# Patient Record
Sex: Female | Born: 1935 | Race: White | Hispanic: No | Smoking: Former smoker
Health system: Southern US, Community
[De-identification: ages and names within clinical notes are randomized; demographics above are authoritative.]

## PROBLEM LIST (undated history)

## (undated) DIAGNOSIS — J449 Chronic obstructive pulmonary disease, unspecified: Secondary | ICD-10-CM

## (undated) DIAGNOSIS — N3281 Overactive bladder: Secondary | ICD-10-CM

## (undated) DIAGNOSIS — E079 Disorder of thyroid, unspecified: Secondary | ICD-10-CM

## (undated) DIAGNOSIS — M1990 Unspecified osteoarthritis, unspecified site: Secondary | ICD-10-CM

## (undated) DIAGNOSIS — I10 Essential (primary) hypertension: Secondary | ICD-10-CM

## (undated) DIAGNOSIS — I671 Cerebral aneurysm, nonruptured: Secondary | ICD-10-CM

## (undated) DIAGNOSIS — E7800 Pure hypercholesterolemia, unspecified: Secondary | ICD-10-CM

## (undated) HISTORY — PX: APPENDECTOMY: SHX54

## (undated) HISTORY — PX: TUBAL LIGATION: SHX77

## (undated) HISTORY — PX: TONSILLECTOMY: SUR1361

---

## 1997-07-29 DIAGNOSIS — I671 Cerebral aneurysm, nonruptured: Secondary | ICD-10-CM

## 1997-07-29 HISTORY — DX: Cerebral aneurysm, nonruptured: I67.1

## 1997-12-30 ENCOUNTER — Emergency Department (HOSPITAL_COMMUNITY): Admission: EM | Admit: 1997-12-30 | Discharge: 1997-12-30 | Payer: Self-pay | Admitting: Emergency Medicine

## 1999-08-30 ENCOUNTER — Ambulatory Visit (HOSPITAL_COMMUNITY): Admission: RE | Admit: 1999-08-30 | Discharge: 1999-08-30 | Payer: Self-pay | Admitting: *Deleted

## 2000-02-15 ENCOUNTER — Ambulatory Visit (HOSPITAL_COMMUNITY): Admission: RE | Admit: 2000-02-15 | Discharge: 2000-02-15 | Payer: Self-pay | Admitting: *Deleted

## 2001-09-11 ENCOUNTER — Other Ambulatory Visit: Admission: RE | Admit: 2001-09-11 | Discharge: 2001-09-11 | Payer: Self-pay | Admitting: *Deleted

## 2010-08-19 ENCOUNTER — Encounter: Payer: Self-pay | Admitting: Internal Medicine

## 2012-09-11 ENCOUNTER — Emergency Department (HOSPITAL_COMMUNITY): Payer: Medicare Other

## 2012-09-11 ENCOUNTER — Inpatient Hospital Stay (HOSPITAL_COMMUNITY)
Admission: EM | Admit: 2012-09-11 | Discharge: 2012-09-16 | DRG: 689 | Disposition: A | Discharge status: Skilled Nursing Facility | Payer: Medicare Other | Attending: Internal Medicine | Admitting: Internal Medicine

## 2012-09-11 ENCOUNTER — Encounter (HOSPITAL_COMMUNITY): Payer: Self-pay | Admitting: *Deleted

## 2012-09-11 DIAGNOSIS — Z79899 Other long term (current) drug therapy: Secondary | ICD-10-CM

## 2012-09-11 DIAGNOSIS — J189 Pneumonia, unspecified organism: Secondary | ICD-10-CM

## 2012-09-11 DIAGNOSIS — E871 Hypo-osmolality and hyponatremia: Secondary | ICD-10-CM

## 2012-09-11 DIAGNOSIS — E039 Hypothyroidism, unspecified: Secondary | ICD-10-CM

## 2012-09-11 DIAGNOSIS — G92 Toxic encephalopathy: Secondary | ICD-10-CM | POA: Diagnosis present

## 2012-09-11 DIAGNOSIS — R4182 Altered mental status, unspecified: Secondary | ICD-10-CM

## 2012-09-11 DIAGNOSIS — E7800 Pure hypercholesterolemia, unspecified: Secondary | ICD-10-CM | POA: Diagnosis present

## 2012-09-11 DIAGNOSIS — G929 Unspecified toxic encephalopathy: Secondary | ICD-10-CM

## 2012-09-11 DIAGNOSIS — J4489 Other specified chronic obstructive pulmonary disease: Secondary | ICD-10-CM | POA: Diagnosis present

## 2012-09-11 DIAGNOSIS — R630 Anorexia: Secondary | ICD-10-CM

## 2012-09-11 DIAGNOSIS — E875 Hyperkalemia: Secondary | ICD-10-CM

## 2012-09-11 DIAGNOSIS — K5900 Constipation, unspecified: Secondary | ICD-10-CM

## 2012-09-11 DIAGNOSIS — I10 Essential (primary) hypertension: Secondary | ICD-10-CM

## 2012-09-11 DIAGNOSIS — W19XXXA Unspecified fall, initial encounter: Secondary | ICD-10-CM

## 2012-09-11 DIAGNOSIS — H3530 Unspecified macular degeneration: Secondary | ICD-10-CM | POA: Diagnosis present

## 2012-09-11 DIAGNOSIS — J449 Chronic obstructive pulmonary disease, unspecified: Secondary | ICD-10-CM | POA: Diagnosis present

## 2012-09-11 DIAGNOSIS — H409 Unspecified glaucoma: Secondary | ICD-10-CM | POA: Diagnosis present

## 2012-09-11 DIAGNOSIS — Z6826 Body mass index (BMI) 26.0-26.9, adult: Secondary | ICD-10-CM

## 2012-09-11 DIAGNOSIS — N390 Urinary tract infection, site not specified: Principal | ICD-10-CM

## 2012-09-11 HISTORY — DX: Disorder of thyroid, unspecified: E07.9

## 2012-09-11 HISTORY — DX: Overactive bladder: N32.81

## 2012-09-11 HISTORY — DX: Cerebral aneurysm, nonruptured: I67.1

## 2012-09-11 HISTORY — DX: Pure hypercholesterolemia, unspecified: E78.00

## 2012-09-11 HISTORY — DX: Unspecified osteoarthritis, unspecified site: M19.90

## 2012-09-11 HISTORY — DX: Chronic obstructive pulmonary disease, unspecified: J44.9

## 2012-09-11 HISTORY — DX: Essential (primary) hypertension: I10

## 2012-09-11 LAB — CBC WITH DIFFERENTIAL/PLATELET
Basophils Absolute: 0 10*3/uL (ref 0.0–0.1)
Lymphocytes Relative: 14 % (ref 12–46)
Lymphs Abs: 1.7 10*3/uL (ref 0.7–4.0)
MCV: 91.5 fL (ref 78.0–100.0)
Monocytes Relative: 9 % (ref 3–12)
Neutrophils Relative %: 76 % (ref 43–77)
Platelets: 279 10*3/uL (ref 150–400)
RDW: 12.9 % (ref 11.5–15.5)
WBC: 12 10*3/uL — ABNORMAL HIGH (ref 4.0–10.5)

## 2012-09-11 LAB — URINALYSIS, ROUTINE W REFLEX MICROSCOPIC
Nitrite: NEGATIVE
Protein, ur: NEGATIVE mg/dL
Specific Gravity, Urine: 1.021 (ref 1.005–1.030)
Urobilinogen, UA: 0.2 mg/dL (ref 0.0–1.0)

## 2012-09-11 LAB — COMPREHENSIVE METABOLIC PANEL
ALT: 11 U/L (ref 0–35)
Albumin: 3.3 g/dL — ABNORMAL LOW (ref 3.5–5.2)
Alkaline Phosphatase: 59 U/L (ref 39–117)
Potassium: 3.9 mEq/L (ref 3.5–5.1)
Sodium: 133 mEq/L — ABNORMAL LOW (ref 135–145)
Total Protein: 7 g/dL (ref 6.0–8.3)

## 2012-09-11 LAB — URINE MICROSCOPIC-ADD ON

## 2012-09-11 LAB — BASIC METABOLIC PANEL
Chloride: 96 mEq/L (ref 96–112)
Creatinine, Ser: 0.73 mg/dL (ref 0.50–1.10)
GFR calc Af Amer: 87 mL/min — ABNORMAL LOW (ref >90)
Sodium: 128 mEq/L — ABNORMAL LOW (ref 135–145)

## 2012-09-11 MED ORDER — HYDRALAZINE HCL 20 MG/ML IJ SOLN
5.0000 mg | Freq: Four times a day (QID) | INTRAMUSCULAR | Status: DC | PRN
  Administered 2012-09-13: 5 mg via INTRAVENOUS
  Filled 2012-09-11: qty 1

## 2012-09-11 MED ORDER — ACETAMINOPHEN 325 MG PO TABS: 650.0000 mg | ORAL_TABLET | Freq: Four times a day (QID) | ORAL | Status: DC | PRN

## 2012-09-11 MED ORDER — DEXTROSE 5 % IV SOLN
1.0000 g | Freq: Once | INTRAVENOUS | Status: AC
  Administered 2012-09-11: 1 g via INTRAVENOUS
  Filled 2012-09-11: qty 10

## 2012-09-11 MED ORDER — ONDANSETRON HCL 4 MG/2ML IJ SOLN: 4.0000 mg | Freq: Four times a day (QID) | INTRAMUSCULAR | Status: DC | PRN

## 2012-09-11 MED ORDER — ONDANSETRON HCL 4 MG PO TABS: 4.0000 mg | ORAL_TABLET | Freq: Four times a day (QID) | ORAL | Status: DC | PRN

## 2012-09-11 MED ORDER — ENOXAPARIN SODIUM 40 MG/0.4ML ~~LOC~~ SOLN
40.0000 mg | SUBCUTANEOUS | Status: DC
  Administered 2012-09-11 – 2012-09-15 (×4): 40 mg via SUBCUTANEOUS
  Filled 2012-09-11 (×6): qty 0.4

## 2012-09-11 MED ORDER — LATANOPROST 0.005 % OP SOLN
1.0000 [drp] | Freq: Every day | OPHTHALMIC | Status: DC
  Administered 2012-09-11 – 2012-09-15 (×4): 1 [drp] via OPHTHALMIC
  Filled 2012-09-11: qty 2.5

## 2012-09-11 MED ORDER — RAMIPRIL 5 MG PO CAPS
5.0000 mg | ORAL_CAPSULE | Freq: Two times a day (BID) | ORAL | Status: DC
  Administered 2012-09-11 – 2012-09-13 (×4): 5 mg via ORAL
  Filled 2012-09-11 (×7): qty 1

## 2012-09-11 MED ORDER — SODIUM CHLORIDE 0.9 % IV BOLUS (SEPSIS)
500.0000 mL | Freq: Once | INTRAVENOUS | Status: AC
  Administered 2012-09-11: 500 mL via INTRAVENOUS

## 2012-09-11 MED ORDER — SODIUM CHLORIDE 0.9 % IV SOLN: INTRAVENOUS | Status: AC

## 2012-09-11 MED ORDER — DEXTROSE-NACL 5-0.45 % IV SOLN
INTRAVENOUS | Status: DC
  Administered 2012-09-11 – 2012-09-13 (×3): via INTRAVENOUS

## 2012-09-11 MED ORDER — ALBUTEROL SULFATE (5 MG/ML) 0.5% IN NEBU
2.5000 mg | INHALATION_SOLUTION | RESPIRATORY_TRACT | Status: DC | PRN
  Administered 2012-09-15 (×2): 2.5 mg via RESPIRATORY_TRACT
  Filled 2012-09-11 (×3): qty 0.5

## 2012-09-11 MED ORDER — ACETAMINOPHEN 650 MG RE SUPP: 650.0000 mg | Freq: Four times a day (QID) | RECTAL | Status: DC | PRN

## 2012-09-11 MED ORDER — DEXTROSE 5 % IV SOLN
1.0000 g | INTRAVENOUS | Status: DC
  Administered 2012-09-12: 1 g via INTRAVENOUS
  Filled 2012-09-11: qty 10

## 2012-09-11 MED ORDER — HYDROCODONE-ACETAMINOPHEN 5-325 MG PO TABS: 1.0000 | ORAL_TABLET | ORAL | Status: DC | PRN

## 2012-09-11 MED ORDER — DORZOLAMIDE HCL-TIMOLOL MAL 2-0.5 % OP SOLN
1.0000 [drp] | Freq: Two times a day (BID) | OPHTHALMIC | Status: DC
  Administered 2012-09-11 – 2012-09-16 (×9): 1 [drp] via OPHTHALMIC
  Filled 2012-09-11: qty 10

## 2012-09-11 MED ORDER — ZOLPIDEM TARTRATE 5 MG PO TABS
5.0000 mg | ORAL_TABLET | Freq: Every evening | ORAL | Status: DC | PRN
  Filled 2012-09-11: qty 1

## 2012-09-11 MED ORDER — LEVOTHYROXINE SODIUM 50 MCG PO TABS
50.0000 ug | ORAL_TABLET | Freq: Every day | ORAL | Status: DC
  Administered 2012-09-12 – 2012-09-16 (×5): 50 ug via ORAL
  Filled 2012-09-11 (×6): qty 1

## 2012-09-11 NOTE — ED Notes (Signed)
QIO:NG29<BM> Expected date:<BR> Expected time:<BR> Means of arrival:<BR> Comments:<BR> **Rm 7**Elderly, one episode confusion, A&amp;O per ems

## 2012-09-11 NOTE — ED Provider Notes (Signed)
Medical screening examination/treatment/procedure(s) were performed by non-physician practitioner and as supervising physician I was immediately available for consultation/collaboration.  Ethelda Chick, MD 09/11/12 [phone]

## 2012-09-11 NOTE — H&P (Addendum)
[facility]                                                                                    Patient Demographics  Julie Francis, is a 79 y.o. female  CSN: [id number]  MRN: [id number]  DOB - Oct 02, 1922  Admit Date - 09/11/2012  Outpatient Primary MD for the patient is Florentina Jenny, MD   With History of -  Past Medical History  Diagnosis Date  . Hypertension   . High cholesterol   . Arthritis   . COPD (chronic obstructive pulmonary disease)   . Thyroid disease   . Overactive bladder   . Brain aneurysm 1999    no medical follow up needed      Past Surgical History  Procedure Laterality Date  . Appendectomy    . Tubal ligation    . Tonsillectomy      in for   Chief Complaint  Patient presents with  . Altered Mental Status     HPI  Julie Francis  is a 79 y.o. female, with past medical history significant for hypothyroidism and hypertension presenting with 2 days history of worsening mentation. Patient denies any chest pains, shortness of breath. She has a headache and mild dizziness. Family was worried and they took her under their care however the mental status started getting worse. There is a history of fall 3 days ago but no head trauma, she just slid from the chair and hit her right hip. The patient usually lives alone and takes care of herself.    Review of Systems   Denies chest pains or shortness of breath, denies abdominal pain diarrhea nausea or vomiting.  get any more . No fever or chills, no history of seizures CVA or TIAs.   Social History History  Substance Use Topics  . Smoking status: Former Smoker    Quit date: 09/11/1994  . Smokeless tobacco: Never Used  . Alcohol Use: No     Family History Her father died of liver cirrhosis, her mother died of uterine cancer   Prior to Admission medications   Medication Sig Start Date End Date Taking? Authorizing Provider  beta carotene w/minerals (OCUVITE) tablet Take 1 tablet  by mouth daily.   Yes Historical Provider, MD  Cranberry 450 MG CAPS Take 1 capsule by mouth daily.   Yes Historical Provider, MD  dorzolamide-timolol (COSOPT) 22.3-6.8 MG/ML ophthalmic solution Place 1 drop into both eyes 2 (two) times daily.   Yes Historical Provider, MD  latanoprost (XALATAN) 0.005 % ophthalmic solution Place 1 drop into both eyes at bedtime.   Yes Historical Provider, MD  levothyroxine (SYNTHROID, LEVOTHROID) 50 MCG tablet Take 50 mcg by mouth daily before breakfast.   Yes Historical Provider, MD  ramipril (ALTACE) 5 MG capsule Take 5 mg by mouth 2 (two) times daily.   Yes Historical Provider, MD    No Known Allergies  Physical Exam  Vitals  Blood pressure 173/91, pulse 89, temperature 98.6 F (37 C), temperature source Oral, resp. rate 16, SpO2 95.00%.   1. General white American female lying in bed in no acute distress.  2. Normal affect and insight, Not  Suicidal or Homicidal, confused  3. No F.N deficits, ALL C.Nerves Intact, Strength 5/5 all 4 extremities, Sensation intact all 4 extremities, Plantars down going.  4. Ears and Eyes appear Normal, Conjunctivae clear, PERRLA. Moist Oral Mucosa.  5. Supple Neck, No JVD, No cervical lymphadenopathy appriciated, No Carotid Bruits.  6. Symmetrical Chest wall movement, Good air movement bilaterally, CTAB.  7. RRR, No Gallops, Rubs or Murmurs, No Parasternal Heave.  8. Positive Bowel Sounds, Abdomen Soft, Non tender, No organomegaly appriciated,No rebound -guarding or rigidity.  9.  No Cyanosis, Normal Skin Turgor, No Skin Rash or Bruise.  10. Good muscle tone,  joints appear normal , no effusions, Normal ROM.  11. No Palpable Lymph Nodes in Neck or Axillae    Data Review  CBC  Recent Labs Lab 09/11/12 1632  WBC 12.0*  HGB 15.1*  HCT 43.0  PLT 279  MCV 91.5  MCH 32.1  MCHC 35.1  RDW 12.9  LYMPHSABS 1.7  MONOABS 1.1*  EOSABS 0.1  BASOSABS 0.0    ------------------------------------------------------------------------------------------------------------------  Chemistries   Recent Labs Lab 09/11/12 1632 09/11/12 1808  NA 128* 133*  K 6.3* 3.9  CL 96 100  CO2 23 23  GLUCOSE 106* 98  BUN 24* 23  CREATININE 0.73 0.75  CALCIUM 10.4 10.4  AST  --  14  ALT  --  11  ALKPHOS  --  59  BILITOT  --  0.7   ------------------------------------------------------------------------------------------------------------------   ---------------------------------------------------------------------------------------------------------------  Urinalysis    Component Value Date/Time   COLORURINE YELLOW 09/11/2012 1722   APPEARANCEUR CLOUDY* 09/11/2012 1722   LABSPEC 1.021 09/11/2012 1722   PHURINE 5.0 09/11/2012 1722   GLUCOSEU NEGATIVE 09/11/2012 1722   HGBUR NEGATIVE 09/11/2012 1722   BILIRUBINUR NEGATIVE 09/11/2012 1722   KETONESUR 15* 09/11/2012 1722   PROTEINUR NEGATIVE 09/11/2012 1722   UROBILINOGEN 0.2 09/11/2012 1722   NITRITE NEGATIVE 09/11/2012 1722   LEUKOCYTESUR MODERATE* 09/11/2012 1722    ----------------------------------------------------------------------------------------------------------------    Imaging results:   Ct Head Wo Contrast  09/11/2012  *RADIOLOGY REPORT*  Clinical Data: Altered mental status.  Recent fall.  CT HEAD WITHOUT CONTRAST  Technique:  Contiguous axial images were obtained from the base of the skull through the vertex without contrast.  Comparison: None.  Findings: There is atrophy and chronic small vessel disease changes. No acute intracranial abnormality.  Specifically, no hemorrhage, hydrocephalus, mass lesion, acute infarction, or significant intracranial injury.  No acute calvarial abnormality.  Soft tissue swelling noted over the high posterior calvarium.  IMPRESSION: No acute intracranial abnormality.  Atrophy, chronic microvascular disease.   Original Report Authenticated By: Charlett Nose,  M.D.    Dg Chest Portable 1 View  09/11/2012  *RADIOLOGY REPORT*  Clinical Data: History of altered mental status.  History of hypertension and COPD.  PORTABLE CHEST - 1 VIEW  Comparison: None.  Findings: Cardiac silhouette is upper range of normal in size. Ectasia of thoracic aorta is seen.  The patient is mildly rotated toward the right.  There is overall slight hyperinflation configuration.  No pulmonary edema, pneumonia, or pleural effusion is seen.  Minimal degenerative spondylosis is present.  IMPRESSION: Overall slight hyperinflation configuration.  No pulmonary edema, pneumonia, or pleural effusion.   Original Report Authenticated By: Onalee Hua Call      Assessment & Plan  1. urinary tract infection; patient was started on IV Rocephin, urine culture pending 2. Altered mental status, probably due to to UTI,Check vitamin B12 and TSH levels 3. Glaucoma and macular degeneration,  continue eye drops 4. Hypothyroidism, continue Synthroid  Plan As mentioned above, the patient will be started on IV antibiotics and IV fluids. Check vitamin B12 and TSH levels. If no improvement in the morning, and MRI should be ordered.    DVT Prophylaxis  Lovenox  AM Labs Ordered, also please review Full Orders  Family Communication: Admission, patients condition and plan of care including tests being ordered have been discussed with the patient and daughters  who indicate understanding and agree with the plan and Code Status.  Code Status full  Disposition Plan: Admit to MedSurg then probably discharge home with home health or nursing facility  Time spent in minutes : 35 minutes  Condition GUARDED*

## 2012-09-11 NOTE — ED Provider Notes (Signed)
History     CSN: [id number]  Arrival date & time 09/11/12  1520   First MD Initiated Contact with Patient 09/11/12 1538      No chief complaint on file.   (Consider location/radiation/quality/duration/timing/severity/associated sxs/prior treatment) Patient is a 79 y.o. female presenting with altered mental status. The history is provided by the patient and a relative.  Altered Mental Status This is a new problem. The current episode started yesterday. Associated symptoms include headaches. Pertinent negatives include no abdominal pain, chest pain, coughing, fever, nausea, neck pain or vomiting. Associated symptoms comments: Per patient and family, she fell 3 days ago. She slid from a sitting position on her bed onto the floor landing on her right hip. She does not remember the fall and does not remember whether or not she hit her head, or whether there was any LOC. She was able to call 9-1-1 and EMS evaluated patient in the home but did not feel transport was necessary. She continued to ambulate with her walker after the fall, however, the family reports that she does not use the right leg as well - not lifting it off the floor when walking, not able to lift the leg onto the bed when lying down. Yesterday, the patient became confused and disoriented and complains of a constant headache. No N, V. She has had a decreased appetite. No cough or fever. .    Past Medical History  Diagnosis Date  . Hypertension   . High cholesterol   . Arthritis   . COPD (chronic obstructive pulmonary disease)   . Thyroid disease   . Overactive bladder   . Brain aneurysm 1999    no medical follow up needed    Past Surgical History  Procedure Laterality Date  . Appendectomy    . Tubal ligation    . Tonsillectomy      History reviewed. No pertinent family history.  History  Substance Use Topics  . Smoking status: Not on file  . Smokeless tobacco: Not on file  . Alcohol Use: Not on file    OB  History   Grav Para Term Preterm Abortions TAB SAB Ect Mult Living                  Review of Systems  Constitutional: Negative for fever.  HENT: Negative for neck pain.   Eyes: Negative for visual disturbance.       H/O macular degeneration with poor eyesight usually.  Respiratory: Negative for cough and shortness of breath.   Cardiovascular: Negative for chest pain.  Gastrointestinal: Negative for nausea, vomiting and abdominal pain.  Genitourinary: Negative for dysuria and pelvic pain.  Neurological: Positive for headaches.  Hematological: Does not bruise/bleed easily.  Psychiatric/Behavioral: Positive for confusion and altered mental status.    Allergies  Review of patient's allergies indicates no known allergies.  Home Medications   Current Outpatient Rx  Name  Route  Sig  Dispense  Refill  . beta carotene w/minerals (OCUVITE) tablet   Oral   Take 1 tablet by mouth daily.         . Cranberry 450 MG CAPS   Oral   Take 1 capsule by mouth daily.         . dorzolamide-timolol (COSOPT) 22.3-6.8 MG/ML ophthalmic solution   Both Eyes   Place 1 drop into both eyes 2 (two) times daily.         Marland Kitchen latanoprost (XALATAN) 0.005 % ophthalmic solution   Both Eyes  Place 1 drop into both eyes at bedtime.         Marland Kitchen levothyroxine (SYNTHROID, LEVOTHROID) 50 MCG tablet   Oral   Take 50 mcg by mouth daily before breakfast.         . ramipril (ALTACE) 5 MG capsule   Oral   Take 5 mg by mouth 2 (two) times daily.           BP 173/91  Pulse 89  Temp(Src) 98.6 F (37 C) (Oral)  Resp 16  SpO2 95%  Physical Exam  Nursing note and vitals reviewed. Constitutional: She appears well-developed and well-nourished.  HENT:  Head: Normocephalic and atraumatic.  Neck: Normal range of motion. Neck supple.  Cardiovascular: Normal rate and regular rhythm.   Pulmonary/Chest: Effort normal. She has no wheezes. She has rales. She exhibits no tenderness.  Abdominal: Soft.  Bowel sounds are normal. There is no tenderness. There is no rebound and no guarding.  Musculoskeletal: Normal range of motion.  Neurological: She is alert.  She is oriented to person.  Skin: Skin is warm and dry. No rash noted.  Psychiatric: She has a normal mood and affect.    ED Course  Procedures (including critical care time)  Labs Reviewed  URINALYSIS, ROUTINE W REFLEX MICROSCOPIC  CBC WITH DIFFERENTIAL  BASIC METABOLIC PANEL   No results found.   No diagnosis found. 1. Altered mental status 2. uti    MDM  Per family, patient is confused "drastically" above baseline mental status. She has lost her appetite. UTI on lab studies - negative CT head. Discussed admission with family and patient - all questions answered.  [facility] consulted and accepts patient for admission.        Arnoldo Hooker, PA-C 09/11/12 1938

## 2012-09-11 NOTE — ED Notes (Signed)
Per EMS pt from home and family has reported pt has AMS. EMS originally called for Stroke but no neuro deficits noted by them.Pt hypertensive 180/88. HR-72 and irregular. CBG 98.

## 2012-09-12 ENCOUNTER — Inpatient Hospital Stay (HOSPITAL_COMMUNITY): Payer: Medicare Other

## 2012-09-12 DIAGNOSIS — E875 Hyperkalemia: Secondary | ICD-10-CM | POA: Diagnosis present

## 2012-09-12 DIAGNOSIS — W19XXXA Unspecified fall, initial encounter: Secondary | ICD-10-CM

## 2012-09-12 DIAGNOSIS — R4182 Altered mental status, unspecified: Secondary | ICD-10-CM

## 2012-09-12 DIAGNOSIS — I10 Essential (primary) hypertension: Secondary | ICD-10-CM | POA: Diagnosis present

## 2012-09-12 DIAGNOSIS — K5900 Constipation, unspecified: Secondary | ICD-10-CM | POA: Diagnosis present

## 2012-09-12 DIAGNOSIS — E871 Hypo-osmolality and hyponatremia: Secondary | ICD-10-CM | POA: Diagnosis present

## 2012-09-12 LAB — VITAMIN B12: Vitamin B-12: 861 pg/mL (ref 211–911)

## 2012-09-12 LAB — TSH: TSH: 1.819 u[IU]/mL (ref 0.350–4.500)

## 2012-09-12 LAB — URINE CULTURE: Colony Count: 45000

## 2012-09-12 MED ORDER — DIPHENHYDRAMINE HCL 25 MG PO CAPS
25.0000 mg | ORAL_CAPSULE | Freq: Once | ORAL | Status: AC
  Administered 2012-09-12: 25 mg via ORAL
  Filled 2012-09-12: qty 1

## 2012-09-12 MED ORDER — PHENAZOPYRIDINE HCL 100 MG PO TABS
100.0000 mg | ORAL_TABLET | Freq: Three times a day (TID) | ORAL | Status: AC
  Administered 2012-09-12 – 2012-09-14 (×8): 100 mg via ORAL
  Filled 2012-09-12 (×9): qty 1

## 2012-09-12 MED ORDER — PHENAZOPYRIDINE HCL 100 MG PO TABS: 100.0000 mg | ORAL_TABLET | Freq: Three times a day (TID) | ORAL | Status: DC

## 2012-09-12 MED ORDER — DOCUSATE SODIUM 100 MG PO CAPS
100.0000 mg | ORAL_CAPSULE | Freq: Two times a day (BID) | ORAL | Status: DC
  Administered 2012-09-12 – 2012-09-13 (×3): 100 mg via ORAL
  Filled 2012-09-12 (×6): qty 1

## 2012-09-12 NOTE — Progress Notes (Addendum)
[facility] PROGRESS NOTE  Julie Francis ZOX:[id number] DOB: [birth year] DOA: 09/11/2012 PCP: Florentina Jenny, MD  Brief narrative: Julie Francis is an 79 y.o. female with a past medical history of hypothyroidism, hypertension, and COPD who presented to the hospital on 09/11/2012 with a 2 day history of altered mental status. She also had a fall 3 days prior to presentation. Upon initial evaluation emergency department, patient had an abnormal urinalysis and her presenting symptoms were attributed to urinary tract infection, for which she was referred to the hospitalist service for further inpatient treatment.  Assessment/Plan: Principal Problem:   UTI (lower urinary tract infection) -Admitted and placed on empiric Rocephin. Followup urine cultures. Active Problems:   Constipation -Colace BID ordered.   Encephalopathy, toxic -Thought to be secondary to urinary tract infection. Treat and monitor for recovery of mental status.   Hypothyroidism -TSH within normal limits. Continue current dose of Synthroid.   Glaucoma / Macular degeneration -Continue Cosopt and Xalatan.   Fall -Left hip films done, no fracture noted. -Physical therapy evaluation when able to participate.   Unspecified essential hypertension -Continue Altace.   Hyponatremia -Likely from dehydration. Improving with IV fluids.   Hyperkalemia -Appears to have been spurious. Potassium was within normal limits upon recheck.   Code Status: Full Family Communication: Daughter at bedside. Disposition Plan: Home versus SNF depending on PT evaluation (Daughter thinks it is unsafe for her to return home to independent living).   Medical Consultants:  None.  Other Consultants:  Physical therapy.  Anti-infectives:  Rocephin 09/11/2012--->  HPI/Subjective: Julie Francis is complaining of some neck pain this morning. She also complains of constipation. Appetite fair. She tells me she has lost some weight. She has  had some dysuria. Had episodes of urinary incontinence through the night.  Objective: Filed Vitals:   09/11/12 1534 09/11/12 2046 09/11/12 2123 09/12/12 0540  BP: 173/91 168/66 157/75 161/74  Pulse: 89  74 71  Temp: 98.6 F (37 C) 98.5 F (36.9 C) 97.9 F (36.6 C) 97.9 F (36.6 C)  TempSrc: Oral Oral Oral Oral  Resp: 16 20 20 24   Height:   5\' 3"  (1.6 m)   Weight:   68.2 kg (150 lb 5.7 oz)   SpO2: 95% 95% 95% 95%    Intake/Output Summary (Last 24 hours) at 09/12/12 0820 Last data filed at 09/12/12 0657  Gross per 24 hour  Intake      0 ml  Output    725 ml  Net   -725 ml    Exam: Gen:  NAD Cardiovascular:  RRR, No M/R/G Respiratory:  Lungs CTAB Gastrointestinal:  Abdomen soft, NT/ND, + BS Extremities:  No C/E/C  Data Reviewed: Basic Metabolic Panel:  Recent Labs Lab 09/11/12 1632 09/11/12 1808  NA 128* 133*  K 6.3* 3.9  CL 96 100  CO2 23 23  GLUCOSE 106* 98  BUN 24* 23  CREATININE 0.73 0.75  CALCIUM 10.4 10.4   GFR Estimated Creatinine Clearance: 45.9 ml/min (by C-G formula based on Cr of 0.75). Liver Function Tests:  Recent Labs Lab 09/11/12 1808  AST 14  ALT 11  ALKPHOS 59  BILITOT 0.7  PROT 7.0  ALBUMIN 3.3*   CBC:  Recent Labs Lab 09/11/12 1632  WBC 12.0*  NEUTROABS 9.1*  HGB 15.1*  HCT 43.0  MCV 91.5  PLT 279   Thyroid function studies  Recent Labs  09/11/12 1815  TSH 1.819   Anemia work up  Recent Labs  09/11/12 1815  VITAMINB12  861   Microbiology No results found for this or any previous visit (from the past 240 hour(s)).   Procedures and Diagnostic Studies: Dg Hip Complete Left  09/12/2012  *RADIOLOGY REPORT*  Clinical Data: Fall  LEFT HIP - COMPLETE 2+ VIEW  Comparison: None.  Findings: No acute fracture and no dislocation.  Osteopenia. Degenerative changes in the lumbar spine are prominent.  Moderate degenerative changes in the hip joints.  IMPRESSION: No acute bony pathology.   Original Report Authenticated By:  Jolaine Click, M.D.    Ct Head Wo Contrast  09/11/2012  *RADIOLOGY REPORT*  Clinical Data: Altered mental status.  Recent fall.  CT HEAD WITHOUT CONTRAST  Technique:  Contiguous axial images were obtained from the base of the skull through the vertex without contrast.  Comparison: None.  Findings: There is atrophy and chronic small vessel disease changes. No acute intracranial abnormality.  Specifically, no hemorrhage, hydrocephalus, mass lesion, acute infarction, or significant intracranial injury.  No acute calvarial abnormality.  Soft tissue swelling noted over the high posterior calvarium.  IMPRESSION: No acute intracranial abnormality.  Atrophy, chronic microvascular disease.   Original Report Authenticated By: Charlett Nose, M.D.    Dg Chest Portable 1 View  09/11/2012  *RADIOLOGY REPORT*  Clinical Data: History of altered mental status.  History of hypertension and COPD.  PORTABLE CHEST - 1 VIEW  Comparison: None.  Findings: Cardiac silhouette is upper range of normal in size. Ectasia of thoracic aorta is seen.  The patient is mildly rotated toward the right.  There is overall slight hyperinflation configuration.  No pulmonary edema, pneumonia, or pleural effusion is seen.  Minimal degenerative spondylosis is present.  IMPRESSION: Overall slight hyperinflation configuration.  No pulmonary edema, pneumonia, or pleural effusion.   Original Report Authenticated By: Onalee Hua Call     Scheduled Meds: . sodium chloride   Intravenous STAT  . cefTRIAXone (ROCEPHIN)  IV  1 g Intravenous Q24H  . dorzolamide-timolol  1 drop Both Eyes BID  . enoxaparin (LOVENOX) injection  40 mg Subcutaneous Q24H  . latanoprost  1 drop Both Eyes QHS  . levothyroxine  50 mcg Oral QAC breakfast  . phenazopyridine  100 mg Oral TID WC  . ramipril  5 mg Oral BID   Continuous Infusions: . dextrose 5 % and 0.45% NaCl 50 mL/hr at 09/11/12 2231    Time spent: 35 minutes.   LOS: 1 day   Julie Francis  [facility] Pager [phone].  If 8PM-8AM, please contact night-coverage at [email or web address], password [facility] 09/12/2012, 8:20 AM

## 2012-09-12 NOTE — Progress Notes (Signed)
UR completed 

## 2012-09-13 DIAGNOSIS — R630 Anorexia: Secondary | ICD-10-CM | POA: Diagnosis present

## 2012-09-13 LAB — BASIC METABOLIC PANEL
Calcium: 10 mg/dL (ref 8.4–10.5)
Creatinine, Ser: 0.73 mg/dL (ref 0.50–1.10)
GFR calc Af Amer: 87 mL/min — ABNORMAL LOW (ref >90)
GFR calc non Af Amer: 75 mL/min — ABNORMAL LOW (ref >90)

## 2012-09-13 LAB — CBC
MCHC: 33.8 g/dL (ref 30.0–36.0)
Platelets: 318 10*3/uL (ref 150–400)
RDW: 13.2 % (ref 11.5–15.5)
WBC: 7.4 10*3/uL (ref 4.0–10.5)

## 2012-09-13 MED ORDER — ENSURE COMPLETE PO LIQD
237.0000 mL | Freq: Two times a day (BID) | ORAL | Status: DC
  Administered 2012-09-13 – 2012-09-16 (×4): 237 mL via ORAL

## 2012-09-13 NOTE — Evaluation (Signed)
Physical Therapy Evaluation Patient Details Name: Julie Francis MRN: [id number] DOB: 1923-06-24 Today's Date: 09/13/2012 Time: 1478-2956 PT Time Calculation (min): 27 min  PT Assessment / Plan / Recommendation Clinical Impression  Pt presents with toxic encephalopathy in the presence of UTI with history of hypothyroidism, HTN and COPD.  Note that she also had a fall at home PTA.  Tolerated OOB and ambulation in hallway at min/mod assist in initially but improved to min assist with continued ambulation.  Pt will benefit from skilled PT in acute venue to address deficits.  PT recommends SNF for follow up at D/C to maximize pts safety and function.     PT Assessment  Patient needs continued PT services    Follow Up Recommendations  SNF;Supervision/Assistance - 24 hour    Does the patient have the potential to tolerate intense rehabilitation      Barriers to Discharge Decreased caregiver support      Equipment Recommendations  Rolling walker with 5" wheels    Recommendations for Other Services OT consult   Frequency Min 3X/week    Precautions / Restrictions Precautions Precautions: Fall Restrictions Weight Bearing Restrictions: No   Pertinent Vitals/Pain No pain      Mobility  Bed Mobility Bed Mobility: Supine to Sit;Sitting - Scoot to Edge of Bed Supine to Sit: 4: Min guard;HOB elevated;With rails Sitting - Scoot to Edge of Bed: 5: Supervision Details for Bed Mobility Assistance: Min/guard to ensure safety of trunk with cues for hand placement on bed to self assist.  Transfers Transfers: Sit to Stand;Stand to Sit Sit to Stand: 4: Min assist;From elevated surface;With upper extremity assist;From bed Stand to Sit: 4: Min guard;With upper extremity assist;With armrests;To chair/3-in-1 Details for Transfer Assistance: Assist to rise and steady with slight posterior lean noted with cues for hand placement and safety.  Ambulation/Gait Ambulation/Gait Assistance: 4: Min  assist;3: Mod assist Ambulation Distance (Feet): 115 Feet Assistive device: Rolling walker Ambulation/Gait Assistance Details: Pt initially with very slow gait pattern and difficulty initating LE advancement, however as she continued gait improved somewhat but still with unsteadiness.  Provided cues for sequencing/technique with RW and max cues to maintain upright posture.  Gait Pattern: Step-to pattern;Step-through pattern;Decreased stride length;Narrow base of support;Trunk flexed Gait velocity: decreased Stairs: No Wheelchair Mobility Wheelchair Mobility: No    Exercises     PT Diagnosis: Difficulty walking;Generalized weakness  PT Problem List: Decreased strength;Decreased activity tolerance;Decreased balance;Decreased mobility;Decreased coordination;Decreased knowledge of use of DME;Decreased safety awareness;Decreased knowledge of precautions PT Treatment Interventions: DME instruction;Gait training;Functional mobility training;Therapeutic activities;Therapeutic exercise;Balance training;Patient/family education   PT Goals Acute Rehab PT Goals PT Goal Formulation: With patient/family Time For Goal Achievement: 09/27/12 Potential to Achieve Goals: Good Pt will go Supine/Side to Sit: with supervision;with HOB 0 degrees PT Goal: Supine/Side to Sit - Progress: Goal set today Pt will go Sit to Supine/Side: with supervision PT Goal: Sit to Supine/Side - Progress: Goal set today Pt will go Sit to Stand: with supervision PT Goal: Sit to Stand - Progress: Goal set today Pt will Ambulate: 51 - 150 feet;with supervision;with least restrictive assistive device PT Goal: Ambulate - Progress: Goal set today Pt will Perform Home Exercise Program: with supervision, verbal cues required/provided PT Goal: Perform Home Exercise Program - Progress: Goal set today  Visit Information  Last PT Received On: 09/13/12 Assistance Needed: +1    Subjective Data  Subjective: Look, I'm walking Patient  Stated Goal: to get stronger.    Prior Functioning  Home Living Lives  With: Alone Available Help at Discharge: Family;Available PRN/intermittently Type of Home: Apartment Home Access: Level entry Home Layout: One level Home Adaptive Equipment: Walker - four wheeled Prior Function Level of Independence: Needs assistance Comments: Daughter states that pt does not bath often and family was checking in with her to make sure she was eating.  Communication Communication: Surveyor, mining Overall Cognitive Status: Appears within functional limits for tasks assessed/performed Arousal/Alertness: Awake/alert Orientation Level: Appears intact for tasks assessed Behavior During Session: [facility] for tasks performed    Extremity/Trunk Assessment Right Lower Extremity Assessment RLE ROM/Strength/Tone: Deficits RLE ROM/Strength/Tone Deficits: Pt with generalized weakness, grossly 3+/5 per functional assessment.  RLE Sensation: WFL - Light Touch Left Lower Extremity Assessment LLE ROM/Strength/Tone: Deficits LLE ROM/Strength/Tone Deficits: Generalized weakness, grosly 3+/5 per functional assessment.  LLE Sensation: WFL - Light Touch Trunk Assessment Trunk Assessment: Kyphotic   Balance    End of Session PT - End of Session Equipment Utilized During Treatment: Gait belt Activity Tolerance: Patient tolerated treatment well Patient left: in chair;with call bell/phone within reach;with family/visitor present Nurse Communication: Mobility status  GP     Vista Deck 09/13/2012, 2:51 PM

## 2012-09-13 NOTE — Progress Notes (Signed)
[facility] PROGRESS NOTE  Julie Francis WJX:[id number] DOB: 1923/05/05 DOA: 09/11/2012 PCP: Florentina Jenny, MD  Brief narrative: Julie Francis is an 80 y.o. female with a past medical history of hypothyroidism, hypertension, and COPD who presented to the hospital on 09/11/2012 with a 2 day history of altered mental status. She also had a fall 3 days prior to presentation. Upon initial evaluation emergency department, patient had an abnormal urinalysis and her presenting symptoms were attributed to urinary tract infection, for which she was referred to the hospitalist service for further inpatient treatment.  Assessment/Plan: Principal Problem:  Encephalopathy -Admitted and placed on empiric Rocephin for possible urinary tract infection-induced toxic encephalopathy. Followup urine cultures only grew 45,000 colonies of multiple bacterial morphotypes. We'll discontinue antibiotics for now. -Etiology unclear. CT scan of the head negative for acute changes. TSH within normal limits. Maybe from a viral syndrome. -Check B12 level. Active Problems:   Anorexia -We'll start Ensure supplements.   Constipation -Continue Colace BID.   Hypothyroidism -TSH within normal limits. Continue current dose of Synthroid.   Glaucoma / Macular degeneration -Continue Cosopt and Xalatan.   Fall -Left hip films done, no fracture noted. -Physical therapy evaluation requested.   Unspecified essential hypertension -Continue Altace.   Hyponatremia -Likely from dehydration. Resolved with IV fluids.   Hyperkalemia -Appears to have been spurious. Potassium was within normal limits upon recheck.   Code Status: Full Family Communication: Daughter at bedside. Disposition Plan: Probable SNF for rehabilitation depending on PT evaluation (Daughter thinks it is unsafe for her to return home to independent living).   Medical Consultants:  None.  Other Consultants:  Physical  therapy.  Anti-infectives:  Rocephin 09/11/2012---> 09/13/2012  HPI/Subjective: Julie Francis continues to feel poorly but has no specific localizing symptoms to speak of. She has some chronic underlying shortness of breath related to COPD for which he has refused inhalers in the past. She has a chronic cough but no change in sputum production. No nausea or vomiting. Appetite poor.  Objective: Filed Vitals:   09/12/12 0540 09/12/12 1338 09/12/12 2220 09/13/12 0638  BP: 161/74 125/51 176/79 172/78  Pulse: 71 81 79 77  Temp: 97.9 F (36.6 C) 98 F (36.7 C) 98.6 F (37 C) 97.7 F (36.5 C)  TempSrc: Oral Oral Oral Oral  Resp: 24 20 20 20   Height:      Weight:      SpO2: 95% 97% 91% 93%    Intake/Output Summary (Last 24 hours) at 09/13/12 0855 Last data filed at 09/13/12 2130  Gross per 24 hour  Intake    600 ml  Output   1350 ml  Net   -750 ml    Exam: Gen:  NAD Cardiovascular:  RRR, No M/R/G Respiratory:  Lungs CTAB Gastrointestinal:  Abdomen soft, NT/ND, + BS Extremities:  No C/E/C  Data Reviewed: Basic Metabolic Panel:  Recent Labs Lab 09/11/12 1632 09/11/12 1808 09/13/12 0359  NA 128* 133* 137  K 6.3* 3.9 3.5  CL 96 100 106  CO2 23 23 26   GLUCOSE 106* 98 105*  BUN 24* 23 12  CREATININE 0.73 0.75 0.73  CALCIUM 10.4 10.4 10.0   GFR Estimated Creatinine Clearance: 45.9 ml/min (by C-G formula based on Cr of 0.73). Liver Function Tests:  Recent Labs Lab 09/11/12 1808  AST 14  ALT 11  ALKPHOS 59  BILITOT 0.7  PROT 7.0  ALBUMIN 3.3*   CBC:  Recent Labs Lab 09/11/12 1632 09/13/12 0359  WBC 12.0* 7.4  NEUTROABS  9.1*  --   HGB 15.1* 13.5  HCT 43.0 39.9  MCV 91.5 93.4  PLT 279 318   Thyroid function studies  Recent Labs  09/11/12 1815  TSH 1.819   Anemia work up  Recent Labs  09/11/12 1815  VITAMINB12 861   Microbiology Recent Results (from the past 240 hour(s))  URINE CULTURE     Status: None   Collection Time    09/11/12   5:22 PM      Result Value Range Status   Specimen Description URINE, CLEAN CATCH   Final   Special Requests NONE   Final   Culture  Setup Time 09/12/2012 01:25   Final   Colony Count 45,000 COLONIES/ML   Final   Culture     Final   Value: Multiple bacterial morphotypes present, none predominant. Suggest appropriate recollection if clinically indicated.   Report Status 09/12/2012 FINAL   Final     Procedures and Diagnostic Studies: Dg Hip Complete Left  09/12/2012  *RADIOLOGY REPORT*  Clinical Data: Fall  LEFT HIP - COMPLETE 2+ VIEW  Comparison: None.  Findings: No acute fracture and no dislocation.  Osteopenia. Degenerative changes in the lumbar spine are prominent.  Moderate degenerative changes in the hip joints.  IMPRESSION: No acute bony pathology.   Original Report Authenticated By: Jolaine Click, M.D.    Ct Head Wo Contrast  09/11/2012  *RADIOLOGY REPORT*  Clinical Data: Altered mental status.  Recent fall.  CT HEAD WITHOUT CONTRAST  Technique:  Contiguous axial images were obtained from the base of the skull through the vertex without contrast.  Comparison: None.  Findings: There is atrophy and chronic small vessel disease changes. No acute intracranial abnormality.  Specifically, no hemorrhage, hydrocephalus, mass lesion, acute infarction, or significant intracranial injury.  No acute calvarial abnormality.  Soft tissue swelling noted over the high posterior calvarium.  IMPRESSION: No acute intracranial abnormality.  Atrophy, chronic microvascular disease.   Original Report Authenticated By: Charlett Nose, M.D.    Dg Chest Portable 1 View  09/11/2012  *RADIOLOGY REPORT*  Clinical Data: History of altered mental status.  History of hypertension and COPD.  PORTABLE CHEST - 1 VIEW  Comparison: None.  Findings: Cardiac silhouette is upper range of normal in size. Ectasia of thoracic aorta is seen.  The patient is mildly rotated toward the right.  There is overall slight hyperinflation  configuration.  No pulmonary edema, pneumonia, or pleural effusion is seen.  Minimal degenerative spondylosis is present.  IMPRESSION: Overall slight hyperinflation configuration.  No pulmonary edema, pneumonia, or pleural effusion.   Original Report Authenticated By: Onalee Hua Call     Scheduled Meds: . cefTRIAXone (ROCEPHIN)  IV  1 g Intravenous Q24H  . docusate sodium  100 mg Oral BID  . dorzolamide-timolol  1 drop Both Eyes BID  . enoxaparin (LOVENOX) injection  40 mg Subcutaneous Q24H  . latanoprost  1 drop Both Eyes QHS  . levothyroxine  50 mcg Oral QAC breakfast  . phenazopyridine  100 mg Oral TID WC  . ramipril  5 mg Oral BID   Continuous Infusions: . dextrose 5 % and 0.45% NaCl 50 mL/hr at 09/12/12 2020    Time spent: 25 minutes.   LOS: 2 days   Zackariah Vanderpol  [facility] Pager [phone].  If 8PM-8AM, please contact night-coverage at [email or web address], password [facility] 09/13/2012, 8:55 AM

## 2012-09-14 MED ORDER — DOCUSATE SODIUM 100 MG PO CAPS
100.0000 mg | ORAL_CAPSULE | Freq: Two times a day (BID) | ORAL | Status: DC | PRN
  Administered 2012-09-16: 100 mg via ORAL
  Filled 2012-09-14 (×2): qty 1

## 2012-09-14 MED ORDER — RAMIPRIL 10 MG PO CAPS
10.0000 mg | ORAL_CAPSULE | Freq: Two times a day (BID) | ORAL | Status: DC
  Administered 2012-09-14 – 2012-09-16 (×4): 10 mg via ORAL
  Filled 2012-09-14 (×6): qty 1

## 2012-09-14 NOTE — Progress Notes (Signed)
Clinical Social Work Department BRIEF PSYCHOSOCIAL ASSESSMENT 09/14/2012  Patient:  [facility]     Account Number:  [id number]     Admit date:  09/11/2012  Clinical Social Worker:  Jacelyn Grip  Date/Time:  09/14/2012 11:20 AM  Referred by:  Physician  Date Referred:  09/14/2012 Referred for  SNF Placement   Other Referral:   Interview type:  Patient Other interview type:   and patient daughter at bedside    PSYCHOSOCIAL DATA Living Status:  ALONE Admitted from facility:   Level of care:   Primary support name:  Cardell Peach Lochner/[phone] Primary support relationship to patient:  CHILD, ADULT Degree of support available:   strong    CURRENT CONCERNS Current Concerns  Post-Acute Placement   Other Concerns:    SOCIAL WORK ASSESSMENT / PLAN CSW received referral for new snf placement.    CSW met with pt and pt daughter, Cardell Peach at bedside re: discharge planning. CSW discussed recommendation for New SNF and explained process. Pt reports that she lives alone in an retirement apartment and is agreeable to short term rehab.    CSW completed FL2 and initiated SNF search to [facility].    CSW to follow up with pt and pt daughter in regard to bed offers.    Per pt and pt daughter, anticipate pt will be medically stable for discharge tomorrow.    CSW to facilitate pt discharge needs when pt medically stable for discharge.   Assessment/plan status:  Psychosocial Support/Ongoing Assessment of Needs Other assessment/ plan:   discharge planning.   Information/referral to community resources:   [facility] list    PATIENT'S/FAMILY'S RESPONSE TO PLAN OF CARE: Pt alert and oriented x 4. Pt daughter at bedside and supportive and actively involved in pt care. Pt is agreeable to rehab at SNF.     Jacklynn Lewis, MSW, LCSWA  Clinical Social Work [phone]

## 2012-09-14 NOTE — Progress Notes (Signed)
CSW provided pt and pt daughter bed offers.   Pt and pt daughter choose bed at [facility].  CSW spoke to facility who confirmed bed availability for tomorrow Tuesday, 2/18.   CSW to facilitate pt discharge needs to [facility] on Tuesday 2/18.   Jacklynn Lewis, MSW, LCSWA  Clinical Social Work [phone]

## 2012-09-14 NOTE — Progress Notes (Signed)
Physical Therapy Treatment Patient Details Name: Julie Francis MRN: [id number] DOB: 1922-09-27 Today's Date: 09/14/2012 Time: 1015-1027 PT Time Calculation (min): 12 min  PT Assessment / Plan / Recommendation Comments on Treatment Session  pt progressing; planning and will will benefit from SNF    Follow Up Recommendations  SNF;Supervision/Assistance - 24 hour     Does the patient have the potential to tolerate intense rehabilitation     Barriers to Discharge        Equipment Recommendations  None recommended by PT    Recommendations for Other Services    Frequency Min 3X/week   Plan Discharge plan remains appropriate;Frequency remains appropriate    Precautions / Restrictions Precautions Precautions: Fall   Pertinent Vitals/Pain     Mobility  Bed Mobility Bed Mobility: Supine to Sit;Sitting - Scoot to Edge of Bed Supine to Sit: 5: Supervision Sitting - Scoot to Edge of Bed: 5: Supervision Details for Bed Mobility Assistance: increased tiem and cues for hand placement/use of UEs to scoot Transfers Transfers: Sit to Stand;Stand to Sit;Stand Pivot Transfers Sit to Stand: 4: Min assist;From elevated surface;With upper extremity assist;From bed Stand to Sit: 4: Min guard;With upper extremity assist;With armrests;To chair/3-in-1 Stand Pivot Transfers: 4: Min guard Details for Transfer Assistance: Assist to rise and steady with slight posterior lean noted with cues for hand placement and safety.  Ambulation/Gait Ambulation/Gait Assistance: 4: Min assist;4: Min Government social research officer (Feet): 120 Feet Assistive device: Rolling walker Ambulation/Gait Assistance Details: cues for upward gaze, posture, Rw distance from self Gait Pattern: Step-to pattern;Step-through pattern;Decreased stride length;Narrow base of support;Trunk flexed Gait velocity: decreased    Exercises     PT Diagnosis:    PT Problem List:   PT Treatment Interventions:     PT Goals Acute Rehab PT  Goals Time For Goal Achievement: 09/27/12 Potential to Achieve Goals: Good Pt will go Supine/Side to Sit: with supervision;with HOB 0 degrees PT Goal: Supine/Side to Sit - Progress: Progressing toward goal Pt will go Sit to Supine/Side: with supervision PT Goal: Sit to Supine/Side - Progress: Progressing toward goal Pt will go Sit to Stand: with supervision PT Goal: Sit to Stand - Progress: Progressing toward goal Pt will Ambulate: 51 - 150 feet;with supervision;with least restrictive assistive device PT Goal: Ambulate - Progress: Progressing toward goal  Visit Information  Last PT Received On: 09/14/12 Assistance Needed: +1    Subjective Data  Subjective: I don't want to   Cognition  Cognition Overall Cognitive Status: Appears within functional limits for tasks assessed/performed Arousal/Alertness: Awake/alert Orientation Level: Appears intact for tasks assessed Behavior During Session: [facility] for tasks performed    Balance     End of Session PT - End of Session Equipment Utilized During Treatment: Gait belt Activity Tolerance: Patient tolerated treatment well Patient left: in chair;with call bell/phone within reach;with family/visitor present Nurse Communication: Mobility status   GP     [facility] 09/14/2012, 10:50 AM

## 2012-09-14 NOTE — Progress Notes (Addendum)
Clinical Social Work Department CLINICAL SOCIAL WORK PLACEMENT NOTE 09/14/2012  Patient:  Julie Francis, Julie Francis  Account Number:  [id number] Admit date:  09/11/2012  Clinical Social Worker:  Jacelyn Grip  Date/time:  09/14/2012 11:30 AM  Clinical Social Work is seeking post-discharge placement for this patient at the following level of care:   SKILLED NURSING   (*CSW will update this form in Epic as items are completed)   09/14/2012  Patient/family provided with [facility] Department of Clinical Social Work's list of facilities offering this level of care within the geographic area requested by the patient (or if unable, by the patient's family).  09/14/2012  Patient/family informed of their freedom to choose among providers that offer the needed level of care, that participate in Medicare, Medicaid or managed care program needed by the patient, have an available bed and are willing to accept the patient.  09/14/2012  Patient/family informed of [facility]' ownership interest in [facility], as well as of the fact that they are under no obligation to receive care at this facility.  PASARR submitted to EDS on 09/14/2012 PASARR number received from EDS on 09/14/2012  FL2 transmitted to all facilities in geographic area requested by pt/family on  09/14/2012 FL2 transmitted to all facilities within larger geographic area on   Patient informed that his/her managed care company has contracts with or will negotiate with  certain facilities, including the following:     Patient/family informed of bed offers received:  09/14/2012 Patient chooses bed at [facility] Physician recommends and patient chooses bed at    Patient to be transferred to  on  [facility] on 09/16/2012 Patient to be transferred to facility by ambulance Sharin Mons)  The following physician request were entered in Epic:   Additional Comments:  Jacklynn Lewis, MSW, LCSWA   Clinical Social Work [phone]

## 2012-09-14 NOTE — Progress Notes (Signed)
[facility] PROGRESS NOTE  Julie Francis ZOX:[id number] DOB: 04-27-1923 DOA: 09/11/2012 PCP: Florentina Jenny, MD  Brief narrative: Julie Francis is an 79 y.o. female with a past medical history of hypothyroidism, hypertension, and COPD who presented to the hospital on 09/11/2012 with a 2 day history of altered mental status. She also had a fall 3 days prior to presentation. Upon initial evaluation emergency department, patient had an abnormal urinalysis and her presenting symptoms were attributed to urinary tract infection, for which she was referred to the hospitalist service for further inpatient treatment.  Assessment/Plan: Principal Problem:  Encephalopathy -Admitted and placed on empiric Rocephin for possible urinary tract infection-induced toxic encephalopathy. Followup urine cultures only grew 45,000 colonies of multiple bacterial morphotypes. Antibiotics subsequently discontinued on 09/13/2012. -Etiology unclear. CT scan of the head negative for acute changes. TSH within normal limits. Maybe from a viral syndrome. -Followup B12 level. Active Problems:   Anorexia -Continue Ensure supplements.   Constipation -Continue Colace BID.   Hypothyroidism -TSH within normal limits. Continue current dose of Synthroid.   Glaucoma / Macular degeneration -Continue Cosopt and Xalatan.   Fall -Left hip films done, no fracture noted. -Physical therapy evaluation performed 09/13/2012:24 hour supervision versus SNF recommended.   Unspecified essential hypertension -Continue Altace. Will increase dose to 10 mg twice a day secondary to systolic blood pressures in the 170s-180s.   Hyponatremia -Likely from dehydration. Resolved with IV fluids. Discontinue IV fluids.   Hyperkalemia -Appears to have been spurious. Potassium was within normal limits upon recheck.  Code Status: Full Family Communication: Daughter at bedside. Disposition Plan: Probable SNF for rehabilitation depending on PT  evaluation (Daughter thinks it is unsafe for her to return home to independent living).   Medical Consultants:  None.  Other Consultants:  Physical therapy: Skilled nursing home placement versus 24 hour supervision.  Anti-infectives:  Rocephin 09/11/2012---> 09/13/2012  HPI/Subjective: [name] feels better this morning. She had some diarrhea yesterday, which he thinks was triggered by taking Colace. Appetite is improving. No dyspnea. No cough.  Objective: Filed Vitals:   09/13/12 1445 09/13/12 2227 09/13/12 2239 09/14/12 0607  BP: 144/61 176/75 146/58 189/83  Pulse: 71 90 72 78  Temp: 97.5 F (36.4 C) 98.1 F (36.7 C)  98.1 F (36.7 C)  TempSrc: Oral Oral  Oral  Resp: 18 18  20   Height:      Weight:      SpO2: 97% 97%  95%    Intake/Output Summary (Last 24 hours) at 09/14/12 0758 Last data filed at 09/14/12 8119  Gross per 24 hour  Intake    600 ml  Output   1100 ml  Net   -500 ml    Exam: Gen:  NAD Cardiovascular:  RRR, No M/R/G Respiratory:  Lungs CTAB Gastrointestinal:  Abdomen soft, NT/ND, + BS Extremities:  No C/E/C  Data Reviewed: Basic Metabolic Panel:  Recent Labs Lab 09/11/12 1632 09/11/12 1808 09/13/12 0359  NA 128* 133* 137  K 6.3* 3.9 3.5  CL 96 100 106  CO2 23 23 26   GLUCOSE 106* 98 105*  BUN 24* 23 12  CREATININE 0.73 0.75 0.73  CALCIUM 10.4 10.4 10.0   GFR Estimated Creatinine Clearance: 45.9 ml/min (by C-G formula based on Cr of 0.73). Liver Function Tests:  Recent Labs Lab 09/11/12 1808  AST 14  ALT 11  ALKPHOS 59  BILITOT 0.7  PROT 7.0  ALBUMIN 3.3*   CBC:  Recent Labs Lab 09/11/12 1632 09/13/12 0359  WBC 12.0*  7.4  NEUTROABS 9.1*  --   HGB 15.1* 13.5  HCT 43.0 39.9  MCV 91.5 93.4  PLT 279 318   Thyroid function studies  Recent Labs  09/11/12 1815  TSH 1.819   Anemia work up  Recent Labs  09/11/12 1815  VITAMINB12 861   Microbiology Recent Results (from the past 240 hour(s))  URINE  CULTURE     Status: None   Collection Time    09/11/12  5:22 PM      Result Value Range Status   Specimen Description URINE, CLEAN CATCH   Final   Special Requests NONE   Final   Culture  Setup Time 09/12/2012 01:25   Final   Colony Count 45,000 COLONIES/ML   Final   Culture     Final   Value: Multiple bacterial morphotypes present, none predominant. Suggest appropriate recollection if clinically indicated.   Report Status 09/12/2012 FINAL   Final     Procedures and Diagnostic Studies: Dg Hip Complete Left  09/12/2012  *RADIOLOGY REPORT*  Clinical Data: Fall  LEFT HIP - COMPLETE 2+ VIEW  Comparison: None.  Findings: No acute fracture and no dislocation.  Osteopenia. Degenerative changes in the lumbar spine are prominent.  Moderate degenerative changes in the hip joints.  IMPRESSION: No acute bony pathology.   Original Report Authenticated By: Jolaine Click, M.D.    Ct Head Wo Contrast  09/11/2012  *RADIOLOGY REPORT*  Clinical Data: Altered mental status.  Recent fall.  CT HEAD WITHOUT CONTRAST  Technique:  Contiguous axial images were obtained from the base of the skull through the vertex without contrast.  Comparison: None.  Findings: There is atrophy and chronic small vessel disease changes. No acute intracranial abnormality.  Specifically, no hemorrhage, hydrocephalus, mass lesion, acute infarction, or significant intracranial injury.  No acute calvarial abnormality.  Soft tissue swelling noted over the high posterior calvarium.  IMPRESSION: No acute intracranial abnormality.  Atrophy, chronic microvascular disease.   Original Report Authenticated By: Charlett Nose, M.D.    Dg Chest Portable 1 View  09/11/2012  *RADIOLOGY REPORT*  Clinical Data: History of altered mental status.  History of hypertension and COPD.  PORTABLE CHEST - 1 VIEW  Comparison: None.  Findings: Cardiac silhouette is upper range of normal in size. Ectasia of thoracic aorta is seen.  The patient is mildly rotated toward the  right.  There is overall slight hyperinflation configuration.  No pulmonary edema, pneumonia, or pleural effusion is seen.  Minimal degenerative spondylosis is present.  IMPRESSION: Overall slight hyperinflation configuration.  No pulmonary edema, pneumonia, or pleural effusion.   Original Report Authenticated By: Onalee Hua Call     Scheduled Meds: . docusate sodium  100 mg Oral BID  . dorzolamide-timolol  1 drop Both Eyes BID  . enoxaparin (LOVENOX) injection  40 mg Subcutaneous Q24H  . feeding supplement  237 mL Oral BID BM  . latanoprost  1 drop Both Eyes QHS  . levothyroxine  50 mcg Oral QAC breakfast  . phenazopyridine  100 mg Oral TID WC  . ramipril  5 mg Oral BID   Continuous Infusions: . dextrose 5 % and 0.45% NaCl 50 mL/hr at 09/13/12 1724    Time spent: 25 minutes.   LOS: 3 days   [place]  [facility] Pager [phone].  If 8PM-8AM, please contact night-coverage at [email or web address], password [facility] 09/14/2012, 7:58 AM

## 2012-09-15 ENCOUNTER — Inpatient Hospital Stay (HOSPITAL_COMMUNITY): Payer: Medicare Other

## 2012-09-15 DIAGNOSIS — J189 Pneumonia, unspecified organism: Secondary | ICD-10-CM | POA: Diagnosis present

## 2012-09-15 MED ORDER — LEVOFLOXACIN 500 MG PO TABS
500.0000 mg | ORAL_TABLET | Freq: Every day | ORAL | Status: DC
  Administered 2012-09-15 – 2012-09-16 (×2): 500 mg via ORAL
  Filled 2012-09-15 (×3): qty 1

## 2012-09-15 MED ORDER — ALBUTEROL SULFATE (5 MG/ML) 0.5% IN NEBU
2.5000 mg | INHALATION_SOLUTION | Freq: Four times a day (QID) | RESPIRATORY_TRACT | Status: DC
  Administered 2012-09-15 – 2012-09-16 (×5): 2.5 mg via RESPIRATORY_TRACT
  Filled 2012-09-15 (×6): qty 0.5

## 2012-09-15 MED ORDER — HYDROCOD POLST-CHLORPHEN POLST 10-8 MG/5ML PO LQCR
5.0000 mL | Freq: Once | ORAL | Status: AC
  Administered 2012-09-15: 5 mL via ORAL
  Filled 2012-09-15: qty 5

## 2012-09-15 NOTE — Progress Notes (Signed)
CSW received notification from RN that pt not yet medically ready for discharge today due to new diagnosis of community acquired pneumonia.  CSW contacted [facility] and updated facility. [facility] stated that they can accept pt when medically stable.  CSW discussed with pt and pt daughters at bedside.  CSW to continue to follow and facilitate pt discharge needs to [facility] when pt medically stable for discharge.  Jacklynn Lewis, MSW, LCSWA  Clinical Social Work [phone]

## 2012-09-15 NOTE — Progress Notes (Signed)
[facility] PROGRESS NOTE  Julie Francis ZOX:[id number] DOB: [birth year] DOA: 09/11/2012 PCP: Florentina Jenny, MD  Brief narrative: Julie Francis is an 79 y.o. female with a past medical history of hypothyroidism, hypertension, and COPD who presented to the hospital on 09/11/2012 with a 2 day history of altered mental status. She also had a fall 3 days prior to presentation. Upon initial evaluation emergency department, patient had an abnormal urinalysis and her presenting symptoms were attributed to urinary tract infection, for which she was referred to the hospitalist service for further inpatient treatment.  Over the course of her hospital stay, she has developed hypoxia and wheeze with a new LUL density on chest radiographs and is not being treated for CAP.  Assessment/Plan: Principal Problem: CAP with encephalopathy -Admitted and placed on empiric Rocephin for possible urinary tract infection-induced toxic encephalopathy. Initial CXR clear.  Followup urine cultures only grew 45,000 colonies of multiple bacterial morphotypes. Antibiotics subsequently discontinued on 09/13/2012. -CT scan of the head negative for acute changes. TSH within normal limits.  -Overnight on 09/15/12, the patient became hypoxic and developed a wheeze so a repeat chest x-ray was ordered which showed a new nodular density LUL.   -Placed on Levaquin 09/15/12 for treatment of CAP.  Continue supplemental oxygen and nebulized bronchodilator therapy. -B12 level WNL. Active Problems:   Anorexia -Continue Ensure supplements.   Constipation -Continue Colace BID.   Hypothyroidism -TSH within normal limits. Continue current dose of Synthroid.   Glaucoma / Macular degeneration -Continue Cosopt and Xalatan.   Fall -Left hip films done, no fracture noted. -Physical therapy evaluation performed 09/13/2012:24 hour supervision versus SNF recommended.   Unspecified essential hypertension -Continue Altace. Will increase dose to  10 mg twice a day secondary to systolic blood pressures in the 170s-180s.   Hyponatremia -Likely from dehydration. Resolved with IV fluids. Discontinue IV fluids.   Hyperkalemia -Appears to have been spurious. Potassium was within normal limits upon recheck.  Code Status: Full Family Communication: Daughter at bedside. Disposition Plan: SNF in next 24-48 hours.   Medical Consultants:  None.  Other Consultants:  Physical therapy: Skilled nursing home placement versus 24 hour supervision.  Anti-infectives:  Rocephin 09/11/2012---> 09/13/2012  HPI/Subjective: Julie Francis feels worse this morning. She was short of breath and hypoxic last night, was put on oxygen, and now has a new wheeze. Objective: Filed Vitals:   09/15/12 0310 09/15/12 0657 09/15/12 1030 09/15/12 1136  BP:  181/67 112/42   Pulse:  66 63   Temp:  97.8 F (36.6 C)    TempSrc:  Oral    Resp:  18 22   Height:      Weight:      SpO2: 88% 95% 99% 100%    Intake/Output Summary (Last 24 hours) at 09/15/12 1148 Last data filed at 09/15/12 0142  Gross per 24 hour  Intake    240 ml  Output    350 ml  Net   -110 ml    Exam: Gen:  NAD Cardiovascular:  RRR, No M/R/G Respiratory:  Lungs CTAB Gastrointestinal:  Abdomen soft, NT/ND, + BS Extremities:  No C/E/C  Data Reviewed: Basic Metabolic Panel:  Recent Labs Lab 09/11/12 1632 09/11/12 1808 09/13/12 0359  NA 128* 133* 137  K 6.3* 3.9 3.5  CL 96 100 106  CO2 23 23 26   GLUCOSE 106* 98 105*  BUN 24* 23 12  CREATININE 0.73 0.75 0.73  CALCIUM 10.4 10.4 10.0   GFR Estimated Creatinine Clearance: 45.9 ml/min (by  C-G formula based on Cr of 0.73). Liver Function Tests:  Recent Labs Lab 09/11/12 1808  AST 14  ALT 11  ALKPHOS 59  BILITOT 0.7  PROT 7.0  ALBUMIN 3.3*   CBC:  Recent Labs Lab 09/11/12 1632 09/13/12 0359  WBC 12.0* 7.4  NEUTROABS 9.1*  --   HGB 15.1* 13.5  HCT 43.0 39.9  MCV 91.5 93.4  PLT 279 318   Anemia work  up  Recent Labs  09/14/12 0355  VITAMINB12 787   Microbiology Recent Results (from the past 240 hour(s))  URINE CULTURE     Status: None   Collection Time    09/11/12  5:22 PM      Result Value Range Status   Specimen Description URINE, CLEAN CATCH   Final   Special Requests NONE   Final   Culture  Setup Time 09/12/2012 01:25   Final   Colony Count 45,000 COLONIES/ML   Final   Culture     Final   Value: Multiple bacterial morphotypes present, none predominant. Suggest appropriate recollection if clinically indicated.   Report Status 09/12/2012 FINAL   Final     Procedures and Diagnostic Studies: Dg Hip Complete Left  09/12/2012  *RADIOLOGY REPORT*  Clinical Data: Fall  LEFT HIP - COMPLETE 2+ VIEW  Comparison: None.  Findings: No acute fracture and no dislocation.  Osteopenia. Degenerative changes in the lumbar spine are prominent.  Moderate degenerative changes in the hip joints.  IMPRESSION: No acute bony pathology.   Original Report Authenticated By: Jolaine Click, M.D.    Ct Head Wo Contrast  09/11/2012  *RADIOLOGY REPORT*  Clinical Data: Altered mental status.  Recent fall.  CT HEAD WITHOUT CONTRAST  Technique:  Contiguous axial images were obtained from the base of the skull through the vertex without contrast.  Comparison: None.  Findings: There is atrophy and chronic small vessel disease changes. No acute intracranial abnormality.  Specifically, no hemorrhage, hydrocephalus, mass lesion, acute infarction, or significant intracranial injury.  No acute calvarial abnormality.  Soft tissue swelling noted over the high posterior calvarium.  IMPRESSION: No acute intracranial abnormality.  Atrophy, chronic microvascular disease.   Original Report Authenticated By: Charlett Nose, M.D.    Dg Chest Portable 1 View  09/11/2012  *RADIOLOGY REPORT*  Clinical Data: History of altered mental status.  History of hypertension and COPD.  PORTABLE CHEST - 1 VIEW  Comparison: None.  Findings: Cardiac  silhouette is upper range of normal in size. Ectasia of thoracic aorta is seen.  The patient is mildly rotated toward the right.  There is overall slight hyperinflation configuration.  No pulmonary edema, pneumonia, or pleural effusion is seen.  Minimal degenerative spondylosis is present.  IMPRESSION: Overall slight hyperinflation configuration.  No pulmonary edema, pneumonia, or pleural effusion.   Original Report Authenticated By: Onalee Hua Call    Dg Chest 1 View  09/15/2012  *RADIOLOGY REPORT*  Clinical Data: Wheezing, hypoxia  CHEST - 1 VIEW  Comparison: 09/11/2012  Findings: The patient is rotated.  Chronic interstitial markings.  Possible nodular opacity in the lateral left upper lobe. No focal consolidation.  No pleural effusion or pneumothorax.  Skin fold overlying the lateral right hemithorax.  Cardiomegaly.  IMPRESSION: No evidence of acute cardiopulmonary disease.  Possible nodular opacity in the lateral left upper lobe. Nonemergent CT chest is suggested for further evaluation.   Original Report Authenticated By: Charline Bills, M.D.     Scheduled Meds: . albuterol  2.5 mg Nebulization Q6H  .  dorzolamide-timolol  1 drop Both Eyes BID  . enoxaparin (LOVENOX) injection  40 mg Subcutaneous Q24H  . feeding supplement  237 mL Oral BID BM  . latanoprost  1 drop Both Eyes QHS  . levofloxacin  500 mg Oral Daily  . levothyroxine  50 mcg Oral QAC breakfast  . ramipril  10 mg Oral BID   Continuous Infusions:    Time spent: 25 minutes.   LOS: 4 days   Samarth Ogle  [facility] Pager [phone].  If 8PM-8AM, please contact night-coverage at [email or web address], password [facility] 09/15/2012, 11:48 AM

## 2012-09-16 DIAGNOSIS — E871 Hypo-osmolality and hyponatremia: Secondary | ICD-10-CM

## 2012-09-16 DIAGNOSIS — G92 Toxic encephalopathy: Secondary | ICD-10-CM

## 2012-09-16 DIAGNOSIS — W19XXXA Unspecified fall, initial encounter: Secondary | ICD-10-CM

## 2012-09-16 MED ORDER — LEVOFLOXACIN 500 MG PO TABS: 500.0000 mg | ORAL_TABLET | Freq: Every day | ORAL | Status: DC

## 2012-09-16 MED ORDER — ALBUTEROL SULFATE (5 MG/ML) 0.5% IN NEBU: 2.5000 mg | INHALATION_SOLUTION | RESPIRATORY_TRACT | Status: DC | PRN

## 2012-09-16 MED ORDER — DSS 100 MG PO CAPS: 100.0000 mg | ORAL_CAPSULE | Freq: Two times a day (BID) | ORAL | Status: DC | PRN

## 2012-09-16 MED ORDER — HYDROCODONE-ACETAMINOPHEN 5-325 MG PO TABS
1.0000 | ORAL_TABLET | ORAL | Status: DC | PRN
Start: 2012-09-16 — End: 2017-06-30

## 2012-09-16 MED ORDER — ENSURE COMPLETE PO LIQD: 237.0000 mL | Freq: Two times a day (BID) | ORAL | Status: DC

## 2012-09-16 NOTE — Progress Notes (Signed)
CSW facilitated pt discharge needs to [facility].  CSW faxed pt discharge information via [facility] and provided RN phone number to call report.   CSW discussed with pt and pt daughter at bedside.  CSW arranged non-emergency ambulance ([facility]) to [facility]  No further social work needs identified at this time.   CSW signing off.   Jacklynn Lewis, MSW, LCSWA  Clinical Social Work [phone]

## 2012-09-16 NOTE — Discharge Summary (Signed)
Physician Discharge Summary  Winslow AVW:[id number] DOB: May 25, 1923 DOA: 09/11/2012  PCP: Florentina Jenny, MD  Admit date: 09/11/2012 Discharge date: 09/16/2012  Recommendations for Outpatient Follow-up:  1. Followup with primary care physician in one to 2 weeks after discharge or sooner if symptoms persist 2. Continue Levaquin for next 5 days on discharge 3. Please make appointment with ophthalmology for regular eye check  Discharge Diagnoses:  Principal Problem:   CAP (community acquired pneumonia) Active Problems:   Encephalopathy, toxic   Hypothyroidism   Glaucoma   Macular degeneration   Fall   Unspecified essential hypertension   Hyponatremia   Hyperkalemia   Unspecified constipation   Anorexia  Discharge Condition: Medically stable and clinically appears well for discharge to skilled nursing facility today  Diet recommendation: As tolerated  History of present illness:  79 y.o. female with a past medical history of hypothyroidism, hypertension, and COPD who presented to the hospital on 09/11/2012 with a 2 day history of altered mental status. She also had a fall 3 days prior to presentation. Upon initial evaluation emergency department, patient had an abnormal urinalysis and her presenting symptoms were attributed to urinary tract infection, for which she was referred to the hospitalist service for further inpatient treatment. Over the course of her hospital stay, she has developed hypoxia and wheeze with a new LUL density on chest radiographs and was started on Levaquin by mouth.  Assessment/Plan:   Principal Problem:  CAP with encephalopathy  -Admitted and placed on empiric Rocephin for possible urinary tract infection-induced toxic encephalopathy. Initial CXR clear. Followup urine cultures only grew 45,000 colonies of multiple bacterial morphotypes. Antibiotics subsequently discontinued on 09/13/2012.  -CT scan of the head negative for acute changes. TSH within  normal limits.  -Overnight on 09/15/12, the patient became hypoxic and developed a wheeze so a repeat chest x-ray was ordered which showed a new nodular density LUL.  -Placed on Levaquin 09/15/12 for treatment of CAP. Patient will continue Levaquin for next 5 days on discharge. -B12 level WNL.  Active Problems:  Anorexia  -Continue Ensure supplements.  Constipation  -Continue Colace BID.  Hypothyroidism  -TSH within normal limits. Continue current dose of Synthroid.  Glaucoma / Macular degeneration  -Continue Cosopt and Xalatan.  Fall  -Left hip films done, no fracture noted.  -Physical therapy evaluation performed 09/13/2012:24 hour supervision versus SNF recommended.  Unspecified essential hypertension  -Continue Altace. Hyponatremia  -Likely from dehydration. Resolved with IV fluids. Discontinue IV fluids.  Hyperkalemia  -Appears to have been spurious. Potassium was within normal limits upon recheck.    Code Status: Full  Family Communication: Daughter at bedside.    Medical Consultants:  None. Other Consultants:  Physical therapy: Skilled nursing home placement versus 24 hour supervision. Anti-infectives:  Rocephin 09/11/2012---> 09/13/2012   Discharge Exam: Filed Vitals:   09/16/12 1144  BP: 151/57  Pulse: 75  Temp:   Resp:    Filed Vitals:   09/16/12 0229 09/16/12 0630 09/16/12 0751 09/16/12 1144  BP:  151/52  151/57  Pulse:  60  75  Temp:  97.9 F (36.6 C)    TempSrc:  Oral    Resp:  16    Height:      Weight:      SpO2: 97% 96% 95% 93%    General: Pt is alert, follows commands appropriately, not in acute distress Cardiovascular: Regular rate and rhythm, S1/S2 +, no murmurs, no rubs, no gallops Respiratory: Clear to auscultation bilaterally, no wheezing, no crackles,  no rhonchi Abdominal: Soft, non tender, non distended, bowel sounds +, no guarding Extremities: no edema, no cyanosis, pulses palpable bilaterally DP and PT Neuro: Grossly  nonfocal  Discharge Instructions  Discharge Orders   Future Orders Complete By Expires     Call MD for:  difficulty breathing, headache or visual disturbances  As directed     Call MD for:  persistant dizziness or light-headedness  As directed     Call MD for:  persistant nausea and vomiting  As directed     Call MD for:  severe uncontrolled pain  As directed     Diet - low sodium heart healthy  As directed     Increase activity slowly  As directed         Medication List    TAKE these medications       albuterol (5 MG/ML) 0.5% nebulizer solution  Commonly known as:  PROVENTIL  Take 0.5 mLs (2.5 mg total) by nebulization every 4 (four) hours as needed for wheezing.     beta carotene w/minerals tablet  Take 1 tablet by mouth daily.     Cranberry 450 MG Caps  Take 1 capsule by mouth daily.     dorzolamide-timolol 22.3-6.8 MG/ML ophthalmic solution  Commonly known as:  COSOPT  Place 1 drop into both eyes 2 (two) times daily.     DSS 100 MG Caps  Take 100 mg by mouth 2 (two) times daily as needed.     feeding supplement Liqd  Take 237 mLs by mouth 2 (two) times daily between meals.     HYDROcodone-acetaminophen 5-325 MG per tablet  Commonly known as:  NORCO/VICODIN  Take 1-2 tablets by mouth every 4 (four) hours as needed for pain.     latanoprost 0.005 % ophthalmic solution  Commonly known as:  XALATAN  Place 1 drop into both eyes at bedtime.     levofloxacin 500 MG tablet  Commonly known as:  LEVAQUIN  Take 1 tablet (500 mg total) by mouth daily.     levothyroxine 50 MCG tablet  Commonly known as:  SYNTHROID, LEVOTHROID  Take 50 mcg by mouth daily before breakfast.     ramipril 5 MG capsule  Commonly known as:  ALTACE  Take 5 mg by mouth 2 (two) times daily.           Follow-up Information   Follow up with Florentina Jenny, MD In 2 weeks.   Contact information:   [address] [place] [address] [phone]        The results of  significant diagnostics from this hospitalization (including imaging, microbiology, ancillary and laboratory) are listed below for reference.    Significant Diagnostic Studies: Dg Chest 1 View  09/15/2012  *RADIOLOGY REPORT*  Clinical Data: Wheezing, hypoxia  CHEST - 1 VIEW  Comparison: 09/11/2012  Findings: The patient is rotated.  Chronic interstitial markings.  Possible nodular opacity in the lateral left upper lobe. No focal consolidation.  No pleural effusion or pneumothorax.  Skin fold overlying the lateral right hemithorax.  Cardiomegaly.  IMPRESSION: No evidence of acute cardiopulmonary disease.  Possible nodular opacity in the lateral left upper lobe. Nonemergent CT chest is suggested for further evaluation.   Original Report Authenticated By: Charline Bills, M.D.    Dg Hip Complete Left  09/12/2012  *RADIOLOGY REPORT*  Clinical Data: Fall  LEFT HIP - COMPLETE 2+ VIEW  Comparison: None.  Findings: No acute fracture and no dislocation.  Osteopenia.  Degenerative changes in the lumbar spine are prominent.  Moderate degenerative changes in the hip joints.  IMPRESSION: No acute bony pathology.   Original Report Authenticated By: Jolaine Click, M.D.    Ct Head Wo Contrast  09/11/2012  *RADIOLOGY REPORT*  Clinical Data: Altered mental status.  Recent fall.  CT HEAD WITHOUT CONTRAST  Technique:  Contiguous axial images were obtained from the base of the skull through the vertex without contrast.  Comparison: None.  Findings: There is atrophy and chronic small vessel disease changes. No acute intracranial abnormality.  Specifically, no hemorrhage, hydrocephalus, mass lesion, acute infarction, or significant intracranial injury.  No acute calvarial abnormality.  Soft tissue swelling noted over the high posterior calvarium.  IMPRESSION: No acute intracranial abnormality.  Atrophy, chronic microvascular disease.   Original Report Authenticated By: Charlett Nose, M.D.    Dg Chest Portable 1 View  09/11/2012   *RADIOLOGY REPORT*  Clinical Data: History of altered mental status.  History of hypertension and COPD.  PORTABLE CHEST - 1 VIEW  Comparison: None.  Findings: Cardiac silhouette is upper range of normal in size. Ectasia of thoracic aorta is seen.  The patient is mildly rotated toward the right.  There is overall slight hyperinflation configuration.  No pulmonary edema, pneumonia, or pleural effusion is seen.  Minimal degenerative spondylosis is present.  IMPRESSION: Overall slight hyperinflation configuration.  No pulmonary edema, pneumonia, or pleural effusion.   Original Report Authenticated By: Onalee Hua Call     Microbiology: Recent Results (from the past 240 hour(s))  URINE CULTURE     Status: None   Collection Time    09/11/12  5:22 PM      Result Value Range Status   Specimen Description URINE, CLEAN CATCH   Final   Special Requests NONE   Final   Culture  Setup Time 09/12/2012 01:25   Final   Colony Count 45,000 COLONIES/ML   Final   Culture     Final   Value: Multiple bacterial morphotypes present, none predominant. Suggest appropriate recollection if clinically indicated.   Report Status 09/12/2012 FINAL   Final     Labs: Basic Metabolic Panel:  Recent Labs Lab 09/11/12 1632 09/11/12 1808 09/13/12 0359  NA 128* 133* 137  K 6.3* 3.9 3.5  CL 96 100 106  CO2 23 23 26   GLUCOSE 106* 98 105*  BUN 24* 23 12  CREATININE 0.73 0.75 0.73  CALCIUM 10.4 10.4 10.0   Liver Function Tests:  Recent Labs Lab 09/11/12 1808  AST 14  ALT 11  ALKPHOS 59  BILITOT 0.7  PROT 7.0  ALBUMIN 3.3*   No results found for this basename: LIPASE, AMYLASE,  in the last 168 hours No results found for this basename: AMMONIA,  in the last 168 hours CBC:  Recent Labs Lab 09/11/12 1632 09/13/12 0359  WBC 12.0* 7.4  NEUTROABS 9.1*  --   HGB 15.1* 13.5  HCT 43.0 39.9  MCV 91.5 93.4  PLT 279 318   Cardiac Enzymes: No results found for this basename: CKTOTAL, CKMB, CKMBINDEX, TROPONINI,  in  the last 168 hours BNP: BNP (last 3 results) No results found for this basename: PROBNP,  in the last 8760 hours CBG: No results found for this basename: GLUCAP,  in the last 168 hours  Time coordinating discharge: Over 30 minutes  Signed:  Manson Passey, MD  [facility]  09/16/2012, 12:51 PM  Pager #: [phone]

## 2012-09-16 NOTE — Progress Notes (Signed)
Physical Therapy Treatment Patient Details Name: Julie Francis MRN: [id number] DOB: 04-07-1923 Today's Date: 09/16/2012 Time: 4098-1191 PT Time Calculation (min): 26 min  PT Assessment / Plan / Recommendation Comments on Treatment Session  Pt not moving as well today after being in bed most of day yesterday.  Feel she will benefit from continued PT at SNF to regain functional independence    Follow Up Recommendations  SNF     Does the patient have the potential to tolerate intense rehabilitation     Barriers to Discharge        Equipment Recommendations       Recommendations for Other Services    Frequency     Plan Discharge plan remains appropriate;Frequency remains appropriate    Precautions / Restrictions Precautions Precautions: Fall   Pertinent Vitals/Pain Standing BP 151/57, HR 75, O2 sat 93% on RA    Mobility  Bed Mobility Bed Mobility: Supine to Sit;Sitting - Scoot to Edge of Bed Supine to Sit: 5: Supervision;4: Min assist Sitting - Scoot to [place] of Bed: 5: Supervision;4: Min assist Details for Bed Mobility Assistance: pt needs assist to bring upper body into sitting Transfers Transfers: Sit to Stand;Stand to Sit;Stand Pivot Transfers Sit to Stand: 4: Min assist;From elevated surface;With upper extremity assist;From bed Stand to Sit: 4: Min guard;With upper extremity assist;With armrests;To chair/3-in-1 Stand Pivot Transfers: 4: Min guard Details for Transfer Assistance: Pt needs extrat time and steady assist in standing. Pt performed stand pivot to bsc and to chair pt stated she was unable to have bowel movement on [facility] Ambulation/Gait Ambulation/Gait Assistance: Other (comment) (pt unable to progress ambulation due to dizziness) Assistive device: Rolling walker General Gait Details: pt uanble to prgress with ambulation due to c/o weakness and dizziness in standing.  checked BP and pt was not orthostatic Stairs: No Wheelchair Mobility Wheelchair Mobility: No     Exercises General Exercises - Lower Extremity Ankle Circles/Pumps: AROM;Both;5 reps;Supine Quad Sets: AROM;Both;5 reps;Supine Long Arc Quad: AROM;Both;5 reps;Seated Hip ABduction/ADduction: AAROM;Both;5 reps;Supine Hip Flexion/Marching: AROM;Both;Supine;Seated;10 reps Other Exercises Other Exercises: standing balance tolerance   PT Diagnosis:    PT Problem List:   PT Treatment Interventions:     PT Goals Acute Rehab PT Goals PT Goal: Ambulate - Progress: Not progressing PT Goal: Perform Home Exercise Program - Progress: Progressing toward goal  Visit Information  Last PT Received On: 09/16/12    Subjective Data  Subjective: "I'm dizzy!" Patient Stated Goal: to get stronger   Cognition  Cognition Overall Cognitive Status: Appears within functional limits for tasks assessed/performed Arousal/Alertness: Awake/alert Orientation Level: Appears intact for tasks assessed Behavior During Session: [facility] for tasks performed    Balance  Balance Balance Assessed: Yes Static Sitting Balance Static Sitting - Balance Support: Bilateral upper extremity supported Static Sitting - Level of Assistance: 6: Modified independent (Device/Increase time) Static Sitting - Comment/# of Minutes: extra time and pt worked feet into shoes that she wanted to wear.  Pt states she needs hard soled shoes to walk in  End of Session PT - End of Session Equipment Utilized During Treatment: Gait belt Activity Tolerance: Treatment limited secondary to medical complications (Comment) (c/o dizziness) Patient left: in chair;with family/visitor present Nurse Communication: Mobility status   GP    Bayard Hugger. Manson Passey, [place] [phone] 09/16/2012, 11:54 AM

## 2012-10-23 ENCOUNTER — Non-Acute Institutional Stay (SKILLED_NURSING_FACILITY): Payer: BLUE CROSS/BLUE SHIELD | Admitting: Internal Medicine

## 2012-10-23 DIAGNOSIS — J189 Pneumonia, unspecified organism: Secondary | ICD-10-CM

## 2012-10-23 DIAGNOSIS — I10 Essential (primary) hypertension: Secondary | ICD-10-CM

## 2012-10-23 DIAGNOSIS — J449 Chronic obstructive pulmonary disease, unspecified: Secondary | ICD-10-CM

## 2012-10-23 DIAGNOSIS — E039 Hypothyroidism, unspecified: Secondary | ICD-10-CM

## 2012-10-30 NOTE — Progress Notes (Signed)
Patient ID: [name], female   DOB: 03/01/1923, 79 y.o.   MRN: [id number]        PROGRESS NOTE  DATE:   10/23/2012  FACILITY:  [facility]   LEVEL OF CARE: SNF  Routine Visit  CHIEF COMPLAINT:  Manage cough, hypothyroidism and hypertension.    HISTORY OF PRESENT ILLNESS:  REASSESSMENT OF ONGOING PROBLEM(S):   Cough.  Patient is complaining of a productive cough for several days.  Denies shortness of breath.  She cannot identify precipitating or alleviating factors.   There are no other associated signs and symptoms.  There is no temporal relationship.  On 10/19/2012, patient was started on Rocephin for 10 days for pneumonitis.    HYPOTHYROIDISM: The hypothyroidism remains stable. No complications noted from the medications presently being used.  The patient denies fatigue or constipation.  A recent TSH is not available.    HTN: Pt 's HTN remains stable.  Denies CP, sob, DOE, pedal edema, headaches, dizziness or visual disturbances.  No complications from the medications currently being used.  Last BP :  161/71, 154/81, 159/70.  PAST MEDICAL HISTORY : Reviewed.  No changes.  CURRENT MEDICATIONS: Reviewed per [facility]  REVIEW OF SYSTEMS:  GENERAL: no change in appetite, no fatigue, no weight changes, no fever, chills or weakness RESPIRATORY: see HPI  CARDIAC: no chest pain, edema or palpitations GI: no abdominal pain, diarrhea, constipation, heart burn, nausea or vomiting  PHYSICAL EXAMINATION  VS:   P  70     BP 161/71    Other vital signs not recorded.    GENERAL: no acute distress, moderately obese body habitus EYES: conjunctivae normal, sclerae normal, normal eye lids NECK: supple, trachea midline, no neck masses, no thyroid tenderness, no thyromegaly LYMPHATICS: no LAN in the neck, no supraclavicular LAN RESPIRATORY: decreased breath sounds bilaterally CARDIAC: RRR, no murmur,no extra heart sounds, no edema GI: abdomen soft, normal BS, no masses, no tenderness, no  hepatomegaly, no splenomegaly PSYCHIATRIC: the patient is alert & oriented to person, affect & behavior appropriate  LABS/RADIOLOGY: On 10/19/2012:  Chest x-ray showed patchy pneumonitis at the right lung base.    09/2012:  CBC normal.    08/2012:  Glucose 117, otherwise BMP normal.    ASSESSMENT/PLAN:  Pneumonitis.  Continue Rocephin.  Add Mucinex 600 mg b.i.d. for 10 days, as well.    Hypothyroidism 244.9.  Continue Synthroid.  Check TSH.    Hypertension.  Uncontrolled.  Add Norvasc 5 mg q.d.    COPD 496.  Stable.    Constipation.  Well controlled.    Urinary incontinence.  Continue oxybutynin.    V58.69.   Check liver profile.  CPT CODE: 64403.

## 2012-11-04 ENCOUNTER — Non-Acute Institutional Stay (SKILLED_NURSING_FACILITY): Payer: Medicare Other | Admitting: Adult Health

## 2012-11-04 DIAGNOSIS — I10 Essential (primary) hypertension: Secondary | ICD-10-CM

## 2012-11-04 DIAGNOSIS — R05 Cough: Secondary | ICD-10-CM

## 2012-11-25 ENCOUNTER — Non-Acute Institutional Stay (SKILLED_NURSING_FACILITY): Payer: Medicare Other | Admitting: Adult Health

## 2012-11-25 DIAGNOSIS — F329 Major depressive disorder, single episode, unspecified: Secondary | ICD-10-CM

## 2012-12-03 ENCOUNTER — Non-Acute Institutional Stay (SKILLED_NURSING_FACILITY): Payer: Medicare Other | Admitting: Adult Health

## 2012-12-03 DIAGNOSIS — N3281 Overactive bladder: Secondary | ICD-10-CM

## 2012-12-03 DIAGNOSIS — I10 Essential (primary) hypertension: Secondary | ICD-10-CM

## 2012-12-03 DIAGNOSIS — E039 Hypothyroidism, unspecified: Secondary | ICD-10-CM

## 2012-12-03 DIAGNOSIS — H409 Unspecified glaucoma: Secondary | ICD-10-CM

## 2012-12-03 DIAGNOSIS — F329 Major depressive disorder, single episode, unspecified: Secondary | ICD-10-CM

## 2012-12-03 DIAGNOSIS — N318 Other neuromuscular dysfunction of bladder: Secondary | ICD-10-CM

## 2012-12-03 DIAGNOSIS — K5900 Constipation, unspecified: Secondary | ICD-10-CM

## 2012-12-09 ENCOUNTER — Encounter: Payer: Self-pay | Admitting: Adult Health

## 2012-12-09 DIAGNOSIS — N3281 Overactive bladder: Secondary | ICD-10-CM | POA: Insufficient documentation

## 2012-12-09 DIAGNOSIS — F329 Major depressive disorder, single episode, unspecified: Secondary | ICD-10-CM | POA: Insufficient documentation

## 2012-12-09 NOTE — Progress Notes (Signed)
  Subjective:    Patient ID: Julie Francis, female    DOB: [birth year], 79 y.o.   MRN: [id number]  HPI  This is an  79 year old female who complained of coughing most especially at night. Patient complained that her coughing keeps her awake at night. No shortness of breath noted.  Review of Systems  Constitutional: Negative for fever, chills and fatigue.  HENT: Negative for congestion.   Eyes: Negative.   Respiratory: Positive for cough. Negative for shortness of breath and wheezing.   Cardiovascular: Positive for leg swelling. Negative for chest pain.  Gastrointestinal: Negative for abdominal distention.  Endocrine: Negative.   Genitourinary: Negative.   Neurological: Negative.   Hematological: Negative for adenopathy. Does not bruise/bleed easily.  Psychiatric/Behavioral: Negative.        Objective:   Physical Exam  Constitutional: She is oriented to person, place, and time. She appears well-developed and well-nourished.  HENT:  Head: Normocephalic and atraumatic.  Right Ear: External ear normal.  Left Ear: External ear normal.  Eyes: Conjunctivae are normal. Pupils are equal, round, and reactive to light.  Neck: Normal range of motion. Neck supple.  Cardiovascular: Normal rate, regular rhythm and normal heart sounds.   Pulmonary/Chest: Breath sounds normal. No respiratory distress. She has no wheezes.  Abdominal: Soft.  Musculoskeletal: She exhibits edema. She exhibits no tenderness.  BLE edema, 2+  Neurological: She is alert and oriented to person, place, and time.  Skin: Skin is warm and dry.  Psychiatric: She has a normal mood and affect. Her behavior is normal. Judgment and thought content normal.   LABS: 10/27/12  Liver profile nl except total protein 5.7  Albumin 3.1  tsh 2.045  Lipid profile nl  hgbA1c 5.9 10/20/12  Wbc 6.4  hgb 12.2  hct 37.1  Medications reviewed per [facility]        Assessment & Plan:   Hypertension  - discontinue Altace due to cough; start  HCTZ 25 mg PO Q D; BP/HR Q shift x 1 week  Cough  - start Mucinex 600 mg PO Q 12 hours

## 2012-12-09 NOTE — Progress Notes (Signed)
  Subjective:    Patient ID: Julie Francis, female    DOB: [birth year], 79 y.o.   MRN: [id number]  HPI   This is an  79 year old female who is for discharge and will be followed by Home health PT, OT, Nursing and Social worker. DME: wheelchair and 3-in-1 commode. She has been admitted to [facility] on 09/16/12 from [facility] with principal discharge diagnosis of CAP with encephalopathy. She has been admitted for a short-term rehabilitation.      Review of Systems  Constitutional: Negative.   HENT: Negative for congestion.   Eyes: Negative.   Respiratory: Negative for cough, shortness of breath and wheezing.   Cardiovascular: Positive for leg swelling. Negative for chest pain.  Gastrointestinal: Negative for abdominal distention.  Endocrine: Negative.   Genitourinary: Negative.   Neurological: Negative.   Hematological: Negative for adenopathy. Does not bruise/bleed easily.  Psychiatric/Behavioral: Negative.        Objective:   Physical Exam  Nursing note and vitals reviewed. Constitutional: She is oriented to person, place, and time. She appears well-developed and well-nourished.  HENT:  Head: Normocephalic and atraumatic.  Right Ear: External ear normal.  Left Ear: External ear normal.  Eyes: Conjunctivae are normal. Pupils are equal, round, and reactive to light.  Neck: Normal range of motion. Neck supple.  Cardiovascular: Normal rate, regular rhythm and normal heart sounds.   Pulmonary/Chest: Breath sounds normal. No respiratory distress. She has no wheezes.  Abdominal: Soft. Bowel sounds are normal.  Musculoskeletal: She exhibits edema. She exhibits no tenderness.  BLE edema, 2+  Neurological: She is alert and oriented to person, place, and time.  Skin: Skin is warm and dry.  Psychiatric: She has a normal mood and affect. Her behavior is normal. Judgment and thought content normal.  Sad affect   LABS: 10/27/12  Liver profile nl except total protein 5.7   Albumin 3.1  tsh 2.045  Lipid profile nl  hgbA1c 5.9 10/20/12  Wbc 6.4  hgb 12.2  hct 37.1  Medications reviewed per [facility]        Assessment & Plan:   Overactive bladder - stable  Depression - stable  CAP (community acquired pneumonia) - resolved  Unspecified essential hypertension - well-controlled  Unspecified constipation - no complaints  Hypothyroidism - stable  Glaucoma - stable

## 2012-12-09 NOTE — Progress Notes (Signed)
  Subjective:    Patient ID: Julie Francis, female    DOB: 01-07-1923, 79 y.o.   MRN: [id number]  HPI   This is an  79 year old female who complained of having no energy and feels depressed. Patient is currently taking Lexapro 10 mg daily for depression.    Review of Systems  Constitutional: Positive for fatigue. Negative for fever and chills.  HENT: Negative for congestion.   Eyes: Negative.   Respiratory: Negative for cough, shortness of breath and wheezing.   Cardiovascular: Positive for leg swelling. Negative for chest pain.  Gastrointestinal: Negative for abdominal distention.  Endocrine: Negative.   Genitourinary: Negative.   Neurological: Negative.   Hematological: Negative for adenopathy. Does not bruise/bleed easily.  Psychiatric/Behavioral: Negative for agitation.       Objective:   Physical Exam  Nursing note and vitals reviewed. Constitutional: She is oriented to person, place, and time. She appears well-developed and well-nourished.  HENT:  Head: Normocephalic and atraumatic.  Right Ear: External ear normal.  Left Ear: External ear normal.  Eyes: Conjunctivae are normal. Pupils are equal, round, and reactive to light.  Neck: Normal range of motion. Neck supple.  Cardiovascular: Normal rate, regular rhythm and normal heart sounds.   Pulmonary/Chest: Breath sounds normal. No respiratory distress. She has no wheezes.  Abdominal: Soft.  Musculoskeletal: She exhibits edema. She exhibits no tenderness.  BLE edema, 2+  Neurological: She is alert and oriented to person, place, and time.  Skin: Skin is warm and dry.  Psychiatric: Her behavior is normal. Judgment and thought content normal.  Sad affect   LABS: 10/27/12  Liver profile nl except total protein 5.7  Albumin 3.1  tsh 2.045  Lipid profile nl  hgbA1c 5.9 10/20/12  Wbc 6.4  hgb 12.2  hct 37.1  Medications reviewed per [facility]        Assessment & Plan:   Depression  -  Increase Lexapro 20 mg 1 PO Q D

## 2014-03-21 ENCOUNTER — Emergency Department (HOSPITAL_COMMUNITY): Payer: Medicare Other

## 2014-03-21 ENCOUNTER — Encounter (HOSPITAL_COMMUNITY): Payer: Self-pay | Admitting: Emergency Medicine

## 2014-03-21 ENCOUNTER — Emergency Department (HOSPITAL_COMMUNITY)
Admission: EM | Admit: 2014-03-21 | Discharge: 2014-03-22 | Disposition: A | Discharge status: Home / Self Care | Payer: Medicare Other | Attending: Emergency Medicine | Admitting: Emergency Medicine

## 2014-03-21 DIAGNOSIS — I160 Hypertensive urgency: Secondary | ICD-10-CM

## 2014-03-21 DIAGNOSIS — R0602 Shortness of breath: Secondary | ICD-10-CM | POA: Insufficient documentation

## 2014-03-21 DIAGNOSIS — M129 Arthropathy, unspecified: Secondary | ICD-10-CM | POA: Diagnosis not present

## 2014-03-21 DIAGNOSIS — Z79899 Other long term (current) drug therapy: Secondary | ICD-10-CM | POA: Diagnosis not present

## 2014-03-21 DIAGNOSIS — Z87891 Personal history of nicotine dependence: Secondary | ICD-10-CM | POA: Diagnosis not present

## 2014-03-21 DIAGNOSIS — B372 Candidiasis of skin and nail: Secondary | ICD-10-CM | POA: Insufficient documentation

## 2014-03-21 DIAGNOSIS — J4489 Other specified chronic obstructive pulmonary disease: Secondary | ICD-10-CM | POA: Insufficient documentation

## 2014-03-21 DIAGNOSIS — I10 Essential (primary) hypertension: Secondary | ICD-10-CM | POA: Diagnosis not present

## 2014-03-21 DIAGNOSIS — R42 Dizziness and giddiness: Secondary | ICD-10-CM | POA: Diagnosis not present

## 2014-03-21 DIAGNOSIS — E079 Disorder of thyroid, unspecified: Secondary | ICD-10-CM | POA: Diagnosis not present

## 2014-03-21 DIAGNOSIS — Z7982 Long term (current) use of aspirin: Secondary | ICD-10-CM | POA: Diagnosis not present

## 2014-03-21 DIAGNOSIS — J449 Chronic obstructive pulmonary disease, unspecified: Secondary | ICD-10-CM | POA: Diagnosis not present

## 2014-03-21 DIAGNOSIS — Z87448 Personal history of other diseases of urinary system: Secondary | ICD-10-CM | POA: Insufficient documentation

## 2014-03-21 LAB — COMPREHENSIVE METABOLIC PANEL
ALBUMIN: 3.5 g/dL (ref 3.5–5.2)
ALK PHOS: 72 U/L (ref 39–117)
ALT: 13 U/L (ref 0–35)
ANION GAP: 12 (ref 5–15)
AST: 14 U/L (ref 0–37)
BILIRUBIN TOTAL: 0.3 mg/dL (ref 0.3–1.2)
BUN: 25 mg/dL — AB (ref 6–23)
CHLORIDE: 101 meq/L (ref 96–112)
CO2: 22 meq/L (ref 19–32)
CREATININE: 1.17 mg/dL — AB (ref 0.50–1.10)
Calcium: 10.8 mg/dL — ABNORMAL HIGH (ref 8.4–10.5)
GFR calc Af Amer: 46 mL/min — ABNORMAL LOW (ref >90)
GFR, EST NON AFRICAN AMERICAN: 40 mL/min — AB (ref >90)
Glucose, Bld: 125 mg/dL — ABNORMAL HIGH (ref 70–99)
POTASSIUM: 4.9 meq/L (ref 3.7–5.3)
Sodium: 135 mEq/L — ABNORMAL LOW (ref 137–147)
Total Protein: 7.4 g/dL (ref 6.0–8.3)

## 2014-03-21 LAB — URINALYSIS, ROUTINE W REFLEX MICROSCOPIC
Bilirubin Urine: NEGATIVE
Glucose, UA: NEGATIVE mg/dL
Hgb urine dipstick: NEGATIVE
KETONES UR: NEGATIVE mg/dL
NITRITE: NEGATIVE
PH: 5 (ref 5.0–8.0)
PROTEIN: NEGATIVE mg/dL
Specific Gravity, Urine: 1.017 (ref 1.005–1.030)
Urobilinogen, UA: 0.2 mg/dL (ref 0.0–1.0)

## 2014-03-21 LAB — CBC
HEMATOCRIT: 48.3 % — AB (ref 36.0–46.0)
HEMOGLOBIN: 15.4 g/dL — AB (ref 12.0–15.0)
MCH: 29.2 pg (ref 26.0–34.0)
MCHC: 31.9 g/dL (ref 30.0–36.0)
MCV: 91.5 fL (ref 78.0–100.0)
Platelets: 531 10*3/uL — ABNORMAL HIGH (ref 150–400)
RBC: 5.28 MIL/uL — AB (ref 3.87–5.11)
RDW: 13.8 % (ref 11.5–15.5)
WBC: 9.1 10*3/uL (ref 4.0–10.5)

## 2014-03-21 LAB — PRO B NATRIURETIC PEPTIDE: PRO B NATRI PEPTIDE: 138.2 pg/mL (ref 0–450)

## 2014-03-21 LAB — I-STAT TROPONIN, ED: Troponin i, poc: 0.01 ng/mL (ref 0.00–0.08)

## 2014-03-21 LAB — URINE MICROSCOPIC-ADD ON

## 2014-03-21 MED ORDER — CLOTRIMAZOLE 1 % EX CREA: TOPICAL_CREAM | CUTANEOUS | Status: AC

## 2014-03-21 NOTE — ED Notes (Signed)
Per EMS: Pt from [facility]. Pt c/o dizziness, blurred vision onset today x 6 hrs. BP 192/82. Pt uses walker at baseline. Hx of CHF. Denies any new pain. NSR with PVCs on monitor. HR 74. CBG 128.

## 2014-03-21 NOTE — ED Notes (Signed)
Pt complains of high blood pressure for two days, she also complains of leg pain

## 2014-03-21 NOTE — ED Notes (Signed)
[facility] called for transportation to [facility]

## 2014-03-21 NOTE — Discharge Instructions (Signed)

## 2014-03-21 NOTE — ED Provider Notes (Signed)
CSN: [id number]     Arrival date & time 03/21/14  1946 History   First MD Initiated Contact with Patient 03/21/14 2007     Chief Complaint  Patient presents with  . Hypertension  . Shortness of Breath   Patient is a 80 y.o. female presenting with hypertension and shortness of breath.  Hypertension  Shortness of Breath   Patient is an 80 y.o. Female with a history of COPD, HTN, memory dysfunction, and remote TIA per family who presents to the ED for dizziness, blurry vision, and worsening shortness of breath.  Patient states that this afternoon she developed some worsening blurry vision and dizziness.  Patient was found to be hypertensive at that time.  The nursing home called her doctor who stated that she needed to be evaluated here.  Patient states that her dizziness has improved, but she is still having blurred vision.  Patient also complains of worsening shortness of breath.  She states that she always has some shortness of breath, but is unsure when it started getting worse.  Patient states that she is coughing and bringing up clear sputum.  She denies fever, chills, nausea, vomiting, chest pain, abdominal pain, urinary complaints, diarrhea, constipation, melena, and hematochezia.  All other ROS are negative.   Past Medical History  Diagnosis Date  . Hypertension   . High cholesterol   . Arthritis   . COPD (chronic obstructive pulmonary disease)   . Thyroid disease   . Overactive bladder   . Brain aneurysm 1999    no medical follow up needed   Past Surgical History  Procedure Laterality Date  . Appendectomy    . Tubal ligation    . Tonsillectomy     History reviewed. No pertinent family history. History  Substance Use Topics  . Smoking status: Former Smoker    Quit date: 09/11/1994  . Smokeless tobacco: Never Used  . Alcohol Use: No   OB History   Grav Para Term Preterm Abortions TAB SAB Ect Mult Living                 Review of Systems  Respiratory: Positive for  shortness of breath.     See HPI  Allergies  Review of patient's allergies indicates no known allergies.  Home Medications   Prior to Admission medications   Medication Sig Start Date End Date Taking? Authorizing Provider  acetaminophen (TYLENOL) 325 MG tablet Take 650 mg by mouth every 6 (six) hours as needed for mild pain.   Yes Historical Provider, MD  albuterol (PROVENTIL HFA;VENTOLIN HFA) 108 (90 BASE) MCG/ACT inhaler Inhale 2 puffs into the lungs daily. At 0900   Yes Historical Provider, MD  albuterol (PROVENTIL) (2.5 MG/3ML) 0.083% nebulizer solution Take 2.5 mg by nebulization 3 (three) times daily.   Yes Historical Provider, MD  aspirin EC 81 MG tablet Take 81 mg by mouth every morning.   Yes Historical Provider, MD  buPROPion (WELLBUTRIN XL) 150 MG 24 hr tablet Take 150 mg by mouth every morning.   Yes Historical Provider, MD  cetirizine (ZYRTEC) 5 MG tablet Take 5 mg by mouth daily. 2000   Yes Historical Provider, MD  Cranberry-Vitamin C (AZO CRANBERRY URINARY TRACT) 250-60 MG CAPS Take 1 capsule by mouth every morning.   Yes Historical Provider, MD  dorzolamide-timolol (COSOPT) 22.3-6.8 MG/ML ophthalmic solution Place 1 drop into both eyes 2 (two) times daily. 0800 and 1600   Yes Historical Provider, MD  furosemide (LASIX) 20 MG tablet Take  20 mg by mouth every morning.   Yes Historical Provider, MD  guaiFENesin (MUCINEX) 600 MG 12 hr tablet Take 600 mg by mouth 2 (two) times daily. 0800 and 2000   Yes Historical Provider, MD  guaifenesin (ROBITUSSIN) 100 MG/5ML syrup Take 600 mg by mouth daily. At 2000   Yes Historical Provider, MD  hydrALAZINE (APRESOLINE) 10 MG tablet Take 10 mg by mouth 3 (three) times daily. 0800 1400 and 2000   Yes Historical Provider, MD  HYDROcodone-acetaminophen (NORCO/VICODIN) 5-325 MG per tablet Take 1-2 tablets by mouth every 4 (four) hours as needed for pain. 09/16/12  Yes Alison Murray, MD  ipratropium-albuterol (DUONEB) 0.5-2.5 (3) MG/3ML SOLN Take  3 mLs by nebulization every 4 (four) hours as needed (breathing).   Yes Historical Provider, MD  ketoconazole (NIZORAL) 2 % cream Apply 1 application topically daily as needed for irritation (stomach).   Yes Historical Provider, MD  latanoprost (XALATAN) 0.005 % ophthalmic solution Place 1 drop into both eyes at bedtime.   Yes Historical Provider, MD  latanoprost (XALATAN) 0.005 % ophthalmic solution Place 1 drop into both eyes at bedtime.   Yes Historical Provider, MD  levothyroxine (SYNTHROID, LEVOTHROID) 50 MCG tablet Take 50 mcg by mouth daily before breakfast.   Yes Historical Provider, MD  omeprazole (PRILOSEC) 20 MG capsule Take 20 mg by mouth at bedtime.   Yes Historical Provider, MD  potassium chloride (K-DUR) 10 MEQ tablet Take 10 mEq by mouth every morning.   Yes Historical Provider, MD  ramipril (ALTACE) 5 MG capsule Take 5 mg by mouth every morning.   Yes Historical Provider, MD  sertraline (ZOLOFT) 50 MG tablet Take 50 mg by mouth every morning.   Yes Historical Provider, MD  clotrimazole (LOTRIMIN) 1 % cream Apply to affected area 2 times daily 03/21/14   Ahsley Attwood A Forcucci, PA-C   BP 153/74  Pulse 79  Temp(Src) 98 F (36.7 C) (Oral)  Resp 18  Ht  (1.6 m)  Wt 160 lb (72.576 kg)  BMI 28.35 kg/m2  SpO2 92% Physical Exam  Nursing note and vitals reviewed. Constitutional: She is oriented to person, place, and time. She appears well-developed and well-nourished. No distress.  HENT:  Head: Normocephalic and atraumatic.  Mouth/Throat: Oropharynx is clear and moist. No oropharyngeal exudate.  Eyes: Conjunctivae and EOM are normal. Pupils are equal, round, and reactive to light. No scleral icterus.  Neck: Normal range of motion. Neck supple. No JVD present. No thyromegaly present.  Cardiovascular: Normal rate, regular rhythm, normal heart sounds and intact distal pulses.  Exam reveals no gallop and no friction rub.   No murmur heard. Pulmonary/Chest: Effort normal and breath  sounds normal. No respiratory distress. She has no wheezes. She has no rales. She exhibits no tenderness.  Abdominal: Soft. Bowel sounds are normal. She exhibits no distension and no mass. There is no tenderness. There is no rebound and no guarding.  Musculoskeletal: Normal range of motion.  Lymphadenopathy:    She has no cervical adenopathy.  Neurological: She is alert and oriented to person, place, and time. No cranial nerve deficit. Coordination normal.  Skin: Skin is warm and dry. She is not diaphoretic.  Psychiatric: She has a normal mood and affect. Her behavior is normal. Judgment and thought content normal.    ED Course  Procedures (including critical care time) Labs Review Labs Reviewed  CBC - Abnormal; Notable for the following:    RBC 5.28 (*)    Hemoglobin 15.4 (*)  HCT 48.3 (*)    Platelets 531 (*)    All other components within normal limits  COMPREHENSIVE METABOLIC PANEL - Abnormal; Notable for the following:    Sodium 135 (*)    Glucose, Bld 125 (*)    BUN 25 (*)    Creatinine, Ser 1.17 (*)    Calcium 10.8 (*)    GFR calc non Af Amer 40 (*)    GFR calc Af Amer 46 (*)    All other components within normal limits  URINALYSIS, ROUTINE W REFLEX MICROSCOPIC - Abnormal; Notable for the following:    APPearance CLOUDY (*)    Leukocytes, UA SMALL (*)    All other components within normal limits  URINE MICROSCOPIC-ADD ON - Abnormal; Notable for the following:    Squamous Epithelial / LPF FEW (*)    Casts HYALINE CASTS (*)    All other components within normal limits  PRO B NATRIURETIC PEPTIDE  I-STAT TROPOININ, ED    Imaging Review Dg Chest 2 View  03/21/2014   CLINICAL DATA:  Cough and shortness of breath.  EXAM: CHEST  2 VIEW  COMPARISON:  09/15/2012.  FINDINGS: Again demonstrated is patient rotation to the right. The cardiac silhouette remains mildly enlarged. The lungs are clear and hyperexpanded. The previously demonstrated focal area of increased density in  the lateral aspect of the left upper lobe is not changed significantly. Thoracic spine degenerative changes. Diffuse osteopenia.  IMPRESSION: No acute abnormality. Stable poorly defined area of increased density in the left upper lobe, changes of COPD and mild cardiomegaly.   Electronically Signed   By: Gordan Payment M.D.   On: 03/21/2014 21:13   Ct Head Wo Contrast  03/21/2014   CLINICAL DATA:  Dizziness and blurred vision for 6 hr. Hypertension.  EXAM: CT HEAD WITHOUT CONTRAST  TECHNIQUE: Contiguous axial images were obtained from the base of the skull through the vertex without intravenous contrast.  COMPARISON:  09/11/2012  FINDINGS: Diffuse cerebral atrophy. Ventricular dilatation consistent with central atrophy. Low-attenuation changes in the deep white matter consistent with small vessel ischemia. Similar appearance to previous study. No mass effect or midline shift. No abnormal extra-axial fluid collections. Gray-white matter junctions are distinct. Basal cisterns are not effaced. No evidence of acute intracranial hemorrhage. No depressed skull fractures. Visualized paranasal sinuses and mastoid air cells are not opacified. Vascular calcifications in the intracranial internal carotid arteries.  IMPRESSION: No acute intracranial abnormalities. Chronic atrophy and small vessel ischemic change.   Electronically Signed   By: Burman Nieves M.D.   On: 03/21/2014 22:07     EKG Interpretation None      MDM   Final diagnoses:  Hypertensive urgency  Candidal skin infection   Patient is an 80 y.o. Female who presents to the ED with dizziness, blurred vision, and shortness of breath.  Physical exam reveals a normal neurological examination at this time.  Given blurred vision Head CT was ordered which was negative for acute intracranial processes.  CBC reveals mildly elevated hemoglobin and hematocrit.  CMP reveals some mild hyponatremia with a mild bump in SCr.  UA shows small leukocytes, but few  epithelials.  Given no UTI symptoms will send urine off for culture.  BNP is negative.  Istat troponin is negative.  CXR shows a stable poorly defined mass in the left upper lobe but no acute processes.  EKG appears to be stable from prior.  Patient was also seen by Dr. Littie Deeds who agrees with the above  plan and workup.  Patient is feeling much better prior to discharge with improvement in BP.  Patient likely had a hypertensive urgency here at this time.  Patient also has a candidal yeast infection in her groin.  Will treat the patient with clotrimazole cream at this time.  Patient is to follow-up with her PCP closely tomorrow.  She was told to return to the ED with stroke like symptoms, ACS, or any other concerning symptoms.  She states understanding and agreement at this time.  Patient is stable for discharge at this time.  All questions were answered and patient agrees to the above plan.      Eben Burow, PA-C 03/21/14 2327  Eben Burow, PA-C 03/21/14 2333

## 2014-03-22 NOTE — ED Provider Notes (Signed)
Medical screening examination/treatment/procedure(s) were conducted as a shared visit with non-physician practitioner(s) and myself.  I personally evaluated the patient during the encounter.   EKG Interpretation None       I performed an examination on the patient including cardiac, pulmonary, and gi systems which were unremarkable except as documented by PA.  Briefly patient is an 80 year old female presenting today with dizziness and blurred vision beginning this a.m. in the setting of hypertension. On arrival the patient had a systolic blood pressure 180. This resolved without intervention. Her symptoms concordantly resolved. Labs and imaging checked, no acute pathology with the exception of a mild elevation in creatinine. As patient of symptoms which are now resolved without intervention, feel her stable to discharge home to follow up with PCP for better hypertension management. The patient was given standard hypertensive emergency return precautions, voiced understanding and agreed to followup  Mirian Mo, MD 03/22/14 [phone]

## 2014-04-16 ENCOUNTER — Emergency Department (HOSPITAL_COMMUNITY)
Admission: EM | Admit: 2014-04-16 | Discharge: 2014-04-16 | Disposition: A | Discharge status: Home / Self Care | Payer: Medicare Other | Attending: Emergency Medicine | Admitting: Emergency Medicine

## 2014-04-16 ENCOUNTER — Encounter (HOSPITAL_COMMUNITY): Payer: Self-pay | Admitting: Emergency Medicine

## 2014-04-16 ENCOUNTER — Emergency Department (HOSPITAL_COMMUNITY): Payer: Medicare Other

## 2014-04-16 DIAGNOSIS — I10 Essential (primary) hypertension: Secondary | ICD-10-CM | POA: Diagnosis not present

## 2014-04-16 DIAGNOSIS — J449 Chronic obstructive pulmonary disease, unspecified: Secondary | ICD-10-CM | POA: Diagnosis not present

## 2014-04-16 DIAGNOSIS — Z87891 Personal history of nicotine dependence: Secondary | ICD-10-CM | POA: Diagnosis not present

## 2014-04-16 DIAGNOSIS — Z79899 Other long term (current) drug therapy: Secondary | ICD-10-CM | POA: Diagnosis not present

## 2014-04-16 DIAGNOSIS — M129 Arthropathy, unspecified: Secondary | ICD-10-CM | POA: Insufficient documentation

## 2014-04-16 DIAGNOSIS — R5383 Other fatigue: Secondary | ICD-10-CM | POA: Diagnosis present

## 2014-04-16 DIAGNOSIS — R5381 Other malaise: Secondary | ICD-10-CM | POA: Diagnosis present

## 2014-04-16 DIAGNOSIS — R531 Weakness: Secondary | ICD-10-CM

## 2014-04-16 DIAGNOSIS — Z7982 Long term (current) use of aspirin: Secondary | ICD-10-CM | POA: Diagnosis not present

## 2014-04-16 DIAGNOSIS — E079 Disorder of thyroid, unspecified: Secondary | ICD-10-CM | POA: Diagnosis not present

## 2014-04-16 DIAGNOSIS — J4489 Other specified chronic obstructive pulmonary disease: Secondary | ICD-10-CM | POA: Insufficient documentation

## 2014-04-16 LAB — DIFFERENTIAL
Basophils Absolute: 0.1 10*3/uL (ref 0.0–0.1)
Basophils Relative: 1 % (ref 0–1)
EOS ABS: 0.2 10*3/uL (ref 0.0–0.7)
Eosinophils Relative: 3 % (ref 0–5)
Lymphocytes Relative: 12 % (ref 12–46)
Lymphs Abs: 0.9 10*3/uL (ref 0.7–4.0)
Monocytes Absolute: 0.7 10*3/uL (ref 0.1–1.0)
Monocytes Relative: 10 % (ref 3–12)
NEUTROS ABS: 5.5 10*3/uL (ref 1.7–7.7)
Neutrophils Relative %: 74 % (ref 43–77)

## 2014-04-16 LAB — URINALYSIS, ROUTINE W REFLEX MICROSCOPIC
Bilirubin Urine: NEGATIVE
GLUCOSE, UA: NEGATIVE mg/dL
Hgb urine dipstick: NEGATIVE
Ketones, ur: NEGATIVE mg/dL
Nitrite: NEGATIVE
PH: 5.5 (ref 5.0–8.0)
PROTEIN: NEGATIVE mg/dL
Specific Gravity, Urine: 1.008 (ref 1.005–1.030)
Urobilinogen, UA: 0.2 mg/dL (ref 0.0–1.0)

## 2014-04-16 LAB — COMPREHENSIVE METABOLIC PANEL
ALT: 10 U/L (ref 0–35)
AST: 13 U/L (ref 0–37)
Albumin: 3.4 g/dL — ABNORMAL LOW (ref 3.5–5.2)
Alkaline Phosphatase: 70 U/L (ref 39–117)
Anion gap: 11 (ref 5–15)
BUN: 23 mg/dL (ref 6–23)
CHLORIDE: 102 meq/L (ref 96–112)
CO2: 24 mEq/L (ref 19–32)
Calcium: 10.3 mg/dL (ref 8.4–10.5)
Creatinine, Ser: 1.13 mg/dL — ABNORMAL HIGH (ref 0.50–1.10)
GFR calc Af Amer: 48 mL/min — ABNORMAL LOW (ref >90)
GFR calc non Af Amer: 42 mL/min — ABNORMAL LOW (ref >90)
Glucose, Bld: 93 mg/dL (ref 70–99)
Potassium: 4.9 mEq/L (ref 3.7–5.3)
SODIUM: 137 meq/L (ref 137–147)
TOTAL PROTEIN: 7.1 g/dL (ref 6.0–8.3)
Total Bilirubin: 0.4 mg/dL (ref 0.3–1.2)

## 2014-04-16 LAB — CBC
HCT: 45.5 % (ref 36.0–46.0)
Hemoglobin: 14.3 g/dL (ref 12.0–15.0)
MCH: 28.7 pg (ref 26.0–34.0)
MCHC: 31.4 g/dL (ref 30.0–36.0)
MCV: 91.2 fL (ref 78.0–100.0)
PLATELETS: 445 10*3/uL — AB (ref 150–400)
RBC: 4.99 MIL/uL (ref 3.87–5.11)
RDW: 13.9 % (ref 11.5–15.5)
WBC: 7.3 10*3/uL (ref 4.0–10.5)

## 2014-04-16 LAB — PROTIME-INR
INR: 1.05 (ref 0.00–1.49)
PROTHROMBIN TIME: 13.7 s (ref 11.6–15.2)

## 2014-04-16 LAB — URINE MICROSCOPIC-ADD ON

## 2014-04-16 LAB — APTT: aPTT: 40 seconds — ABNORMAL HIGH (ref 24–37)

## 2014-04-16 LAB — I-STAT TROPONIN, ED: Troponin i, poc: 0 ng/mL (ref 0.00–0.08)

## 2014-04-16 MED ORDER — LORAZEPAM 2 MG/ML IJ SOLN
0.5000 mg | Freq: Once | INTRAMUSCULAR | Status: AC
  Administered 2014-04-16: 0.5 mg via INTRAVENOUS
  Filled 2014-04-16: qty 1

## 2014-04-16 NOTE — ED Notes (Signed)
She remains in no distress, and her two daughters are with her.

## 2014-04-16 NOTE — Discharge Instructions (Signed)

## 2014-04-16 NOTE — ED Notes (Signed)
She states she simply "don't feel good".  She c/o recent fluctuations (high) in her b/p for which she has seen her PCP.  Today she c/o "blurred vision and legs aching, which isn't new".  She is in no distress; and her daughter has just arrived.

## 2014-04-16 NOTE — ED Notes (Addendum)
Pt in from SNF, c/o just not feeling well over last week. Feels her BP may be up. C/o HA. EMS BP 150/90, p 60.

## 2014-04-16 NOTE — ED Notes (Signed)
Pt transported to MRI 

## 2014-04-16 NOTE — ED Notes (Signed)
I have just notified [facility] for transport back to [facility]).

## 2014-04-16 NOTE — ED Provider Notes (Signed)
CSN: [id number]     Arrival date & time 04/16/14  1005 History   First MD Initiated Contact with Patient 04/16/14 1013     Chief Complaint  Patient presents with  . Fatigue  . Hypertension    HPI The patient is not exactly sure when her symptoms started but this morning she was not feeling well. She felt like her vision was blurred. She felt like her balance was off. She also felt like she wasn't able to speak normally. The patient has had trouble over the last month or so with fatigue and malaise. She's had blood pressure problems and has had to have her medications adjusted. She was in the emergency room on August 25 for similar symptoms at that time her blood pressure was very elevated.  This morning when she noticed her symptoms the staff at her facility took her blood pressure. Past Medical History  Diagnosis Date  . Hypertension   . High cholesterol   . Arthritis   . COPD (chronic obstructive pulmonary disease)   . Thyroid disease   . Overactive bladder   . Brain aneurysm 1999    no medical follow up needed   Past Surgical History  Procedure Laterality Date  . Appendectomy    . Tubal ligation    . Tonsillectomy     No family history on file. History  Substance Use Topics  . Smoking status: Former Smoker    Quit date: 09/11/1994  . Smokeless tobacco: Never Used  . Alcohol Use: No   OB History   Grav Para Term Preterm Abortions TAB SAB Ect Mult Living                 Review of Systems  All other systems reviewed and are negative.     Allergies  Review of patient's allergies indicates no known allergies.  Home Medications   Prior to Admission medications   Medication Sig Start Date End Date Taking? Authorizing Provider  acetaminophen (TYLENOL) 325 MG tablet Take 650 mg by mouth every 6 (six) hours as needed for mild pain.   Yes Historical Provider, MD  albuterol (PROVENTIL HFA;VENTOLIN HFA) 108 (90 BASE) MCG/ACT inhaler Inhale 2 puffs into the lungs daily. At  0900   Yes Historical Provider, MD  albuterol (PROVENTIL) (2.5 MG/3ML) 0.083% nebulizer solution Take 2.5 mg by nebulization 3 (three) times daily.   Yes Historical Provider, MD  aspirin EC 81 MG tablet Take 81 mg by mouth every morning.   Yes Historical Provider, MD  buPROPion (WELLBUTRIN XL) 150 MG 24 hr tablet Take 150 mg by mouth every morning.   Yes Historical Provider, MD  cetirizine (ZYRTEC) 5 MG tablet Take 5 mg by mouth daily. 2100   Yes Historical Provider, MD  clotrimazole (LOTRIMIN) 1 % cream Apply to affected area 2 times daily 03/21/14  Yes Courtney A Forcucci, PA-C  Cranberry-Vitamin C (AZO CRANBERRY URINARY TRACT) 250-60 MG CAPS Take 1 capsule by mouth every morning.   Yes Historical Provider, MD  dorzolamide-timolol (COSOPT) 22.3-6.8 MG/ML ophthalmic solution Place 1 drop into both eyes 2 (two) times daily. 0900 and 1700   Yes Historical Provider, MD  furosemide (LASIX) 20 MG tablet Take 20 mg by mouth every morning.   Yes Historical Provider, MD  guaiFENesin (MUCINEX) 600 MG 12 hr tablet Take 600 mg by mouth 2 (two) times daily. 0900 and 2100   Yes Historical Provider, MD  guaifenesin (ROBITUSSIN) 100 MG/5ML syrup Take 600 mg by mouth daily.  At 2100   Yes Historical Provider, MD  hydrALAZINE (APRESOLINE) 10 MG tablet Take 10 mg by mouth 3 (three) times daily. 0900 1500 and 2100   Yes Historical Provider, MD  HYDROcodone-acetaminophen (NORCO/VICODIN) 5-325 MG per tablet Take 1-2 tablets by mouth every 4 (four) hours as needed for pain. 09/16/12  Yes Alison Murray, MD  ipratropium-albuterol (DUONEB) 0.5-2.5 (3) MG/3ML SOLN Take 3 mLs by nebulization every 4 (four) hours as needed (breathing).   Yes Historical Provider, MD  ketoconazole (NIZORAL) 2 % cream Apply 1 application topically daily as needed for irritation (stomach).   Yes Historical Provider, MD  latanoprost (XALATAN) 0.005 % ophthalmic solution Place 1 drop into both eyes at bedtime.   Yes Historical Provider, MD   levothyroxine (SYNTHROID, LEVOTHROID) 50 MCG tablet Take 50 mcg by mouth daily before breakfast.   Yes Historical Provider, MD  lisinopril (PRINIVIL,ZESTRIL) 10 MG tablet Take 10 mg by mouth every morning.   Yes Historical Provider, MD  mineral oil external liquid Place 2 drops into both ears once a week.   Yes Historical Provider, MD  omeprazole (PRILOSEC) 20 MG capsule Take 20 mg by mouth at bedtime.   Yes Historical Provider, MD  potassium chloride (K-DUR) 10 MEQ tablet Take 10 mEq by mouth every morning.   Yes Historical Provider, MD  sertraline (ZOLOFT) 50 MG tablet Take 50 mg by mouth every morning.   Yes Historical Provider, MD   BP 119/59  Pulse 77  Temp(Src) 98.1 F (36.7 C) (Oral)  Resp 16  SpO2 96% Physical Exam  Nursing note and vitals reviewed. Constitutional: She is oriented to person, place, and time. She appears well-developed and well-nourished. No distress.  HENT:  Head: Normocephalic and atraumatic.  Right Ear: External ear normal.  Left Ear: External ear normal.  Mouth/Throat: Oropharynx is clear and moist.  Eyes: Conjunctivae are normal. Right eye exhibits no discharge. Left eye exhibits no discharge. No scleral icterus.  Neck: Neck supple. No tracheal deviation present.  Cardiovascular: Normal rate, regular rhythm and intact distal pulses.   Pulmonary/Chest: Effort normal and breath sounds normal. No stridor. No respiratory distress. She has no wheezes. She has no rales.  Abdominal: Soft. Bowel sounds are normal. She exhibits no distension. There is no tenderness. There is no rebound and no guarding.  Musculoskeletal: She exhibits no edema and no tenderness.  Neurological: She is alert and oriented to person, place, and time. She has normal strength. No cranial nerve deficit (No facial droop, extraocular movements intact, tongue midline ) or sensory deficit. She exhibits normal muscle tone. She displays no seizure activity. Coordination normal.  No pronator drift  bilateral upper extrem, able to hold both legs off bed for 5 seconds, sensation intact in all extremities, no visual field cuts, no left or right sided neglect, normal finger-nose exam bilaterally, no nystagmus noted   Skin: Skin is warm and dry. No rash noted.  Psychiatric: She has a normal mood and affect.    ED Course  Procedures (including critical care time) Labs Review Labs Reviewed  APTT - Abnormal; Notable for the following:    aPTT 40 (*)    All other components within normal limits  CBC - Abnormal; Notable for the following:    Platelets 445 (*)    All other components within normal limits  COMPREHENSIVE METABOLIC PANEL - Abnormal; Notable for the following:    Creatinine, Ser 1.13 (*)    Albumin 3.4 (*)    GFR calc non  Af Amer 42 (*)    GFR calc Af Amer 48 (*)    All other components within normal limits  URINALYSIS, ROUTINE W REFLEX MICROSCOPIC - Abnormal; Notable for the following:    Leukocytes, UA SMALL (*)    All other components within normal limits  PROTIME-INR  DIFFERENTIAL  URINE MICROSCOPIC-ADD ON  Rosezena Sensor, ED    Imaging Review Mr Brain Wo Contrast  04/16/2014   CLINICAL DATA:  Hypertension.  Blurred vision.  Balance disturbance.  EXAM: MRI HEAD WITHOUT CONTRAST  TECHNIQUE: Multiplanar, multiecho pulse sequences of the brain and surrounding structures were obtained without intravenous contrast.  COMPARISON:  Head CT 03/21/2014  FINDINGS: Diffusion imaging does not show any acute or subacute infarction. There chronic small-vessel ischemic changes affecting the pons. No focal cerebellar insult. There are chronic small-vessel ischemic changes affecting the thalami, basal ganglia and throughout the cerebral hemispheric white matter. No cortical or large vessel territory infarction. No mass lesion, hemorrhage, hydrocephalus or extra-axial collection. No pituitary mass. No inflammatory sinus disease. No skull or skullbase lesion. Major vessels are patent at  the base of the brain.  IMPRESSION: No acute infarction. Chronic small-vessel ischemic changes throughout the brain as outlined above.   Electronically Signed   By: Paulina Fusi M.D.   On: 04/16/2014 13:02     EKG Interpretation   Date/Time:  Saturday April 16 2014 10:12:18 EDT Ventricular Rate:  78 PR Interval:  166 QRS Duration: 65 QT Interval:  337 QTC Calculation: 384 R Axis:   81 Text Interpretation:  Sinus rhythm Borderline right axis deviation  Nonspecific T abnormalities, lateral leads No significant change since  last tracing Confirmed by Nabil Bubolz  MD-J, Joseandres Mazer ([id number]) on 04/16/2014 10:59:42  AM      MDM   Final diagnoses:  Weakness  Other fatigue    The patient's workup in the rigidity is unremarkable. She is not anemic. There are no signs to suggest an acute infection.  I evaluated her brain with MRI to rule out a posterior circulation stroke causing her dizziness and weakness. MRI was unremarkable.  At this point I cannot explain the cause of her weakness and fatigue but she has no focal deficits and no reason at this time to be admitted to the hospital.  I discussed the findings and the plan with the patient and her family. I encouraged her to follow up with her primary doctor.    Linwood Dibbles, MD 04/16/14 [phone]

## 2014-04-16 NOTE — ED Notes (Signed)
Bed: ZO10 Expected date: 04/16/14 Expected time: 10:06 AM Means of arrival: Ambulance Comments: hypertension

## 2014-04-16 NOTE — ED Notes (Signed)
She has returned from [facility]. And remains in no distress.

## 2014-06-09 ENCOUNTER — Emergency Department (HOSPITAL_COMMUNITY): Payer: Medicare Other

## 2014-06-09 ENCOUNTER — Emergency Department (HOSPITAL_COMMUNITY)
Admission: EM | Admit: 2014-06-09 | Discharge: 2014-06-09 | Disposition: A | Discharge status: Home / Self Care | Payer: Medicare Other | Attending: Emergency Medicine | Admitting: Emergency Medicine

## 2014-06-09 ENCOUNTER — Encounter (HOSPITAL_COMMUNITY): Payer: Self-pay | Admitting: Emergency Medicine

## 2014-06-09 DIAGNOSIS — Z7982 Long term (current) use of aspirin: Secondary | ICD-10-CM | POA: Diagnosis not present

## 2014-06-09 DIAGNOSIS — Z79899 Other long term (current) drug therapy: Secondary | ICD-10-CM | POA: Insufficient documentation

## 2014-06-09 DIAGNOSIS — I671 Cerebral aneurysm, nonruptured: Secondary | ICD-10-CM | POA: Insufficient documentation

## 2014-06-09 DIAGNOSIS — I10 Essential (primary) hypertension: Secondary | ICD-10-CM | POA: Diagnosis not present

## 2014-06-09 DIAGNOSIS — J449 Chronic obstructive pulmonary disease, unspecified: Secondary | ICD-10-CM | POA: Insufficient documentation

## 2014-06-09 DIAGNOSIS — Z87448 Personal history of other diseases of urinary system: Secondary | ICD-10-CM | POA: Insufficient documentation

## 2014-06-09 DIAGNOSIS — S0093XA Contusion of unspecified part of head, initial encounter: Secondary | ICD-10-CM

## 2014-06-09 DIAGNOSIS — Z87891 Personal history of nicotine dependence: Secondary | ICD-10-CM | POA: Insufficient documentation

## 2014-06-09 DIAGNOSIS — S0083XA Contusion of other part of head, initial encounter: Secondary | ICD-10-CM | POA: Diagnosis not present

## 2014-06-09 DIAGNOSIS — Y9289 Other specified places as the place of occurrence of the external cause: Secondary | ICD-10-CM | POA: Diagnosis not present

## 2014-06-09 DIAGNOSIS — Y998 Other external cause status: Secondary | ICD-10-CM | POA: Diagnosis not present

## 2014-06-09 DIAGNOSIS — E079 Disorder of thyroid, unspecified: Secondary | ICD-10-CM | POA: Diagnosis not present

## 2014-06-09 DIAGNOSIS — Y9389 Activity, other specified: Secondary | ICD-10-CM | POA: Insufficient documentation

## 2014-06-09 DIAGNOSIS — M1990 Unspecified osteoarthritis, unspecified site: Secondary | ICD-10-CM | POA: Diagnosis not present

## 2014-06-09 DIAGNOSIS — W01198A Fall on same level from slipping, tripping and stumbling with subsequent striking against other object, initial encounter: Secondary | ICD-10-CM | POA: Diagnosis not present

## 2014-06-09 DIAGNOSIS — W19XXXA Unspecified fall, initial encounter: Secondary | ICD-10-CM

## 2014-06-09 DIAGNOSIS — S0990XA Unspecified injury of head, initial encounter: Secondary | ICD-10-CM | POA: Diagnosis present
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
## 2014-06-09 NOTE — ED Notes (Signed)
Bed: WA07 Expected date:  Expected time:  Means of arrival:  Comments: EMS FALL

## 2014-06-09 NOTE — Discharge Instructions (Signed)
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
## 2017-06-29 ENCOUNTER — Encounter (HOSPITAL_COMMUNITY): Payer: Self-pay | Admitting: Emergency Medicine

## 2017-06-29 ENCOUNTER — Other Ambulatory Visit: Payer: Self-pay

## 2017-06-29 ENCOUNTER — Emergency Department (HOSPITAL_COMMUNITY): Payer: Medicare Other

## 2017-06-29 ENCOUNTER — Inpatient Hospital Stay (HOSPITAL_COMMUNITY)
Admission: EM | Admit: 2017-06-29 | Discharge: 2017-07-04 | DRG: 690 | Disposition: A | Discharge status: Skilled Nursing Facility | Payer: Medicare Other | Attending: Internal Medicine | Admitting: Internal Medicine

## 2017-06-29 DIAGNOSIS — K222 Esophageal obstruction: Secondary | ICD-10-CM | POA: Diagnosis present

## 2017-06-29 DIAGNOSIS — I10 Essential (primary) hypertension: Secondary | ICD-10-CM | POA: Diagnosis present

## 2017-06-29 DIAGNOSIS — K2900 Acute gastritis without bleeding: Secondary | ICD-10-CM | POA: Diagnosis present

## 2017-06-29 DIAGNOSIS — I5030 Unspecified diastolic (congestive) heart failure: Secondary | ICD-10-CM | POA: Diagnosis present

## 2017-06-29 DIAGNOSIS — R109 Unspecified abdominal pain: Secondary | ICD-10-CM | POA: Diagnosis present

## 2017-06-29 DIAGNOSIS — N3281 Overactive bladder: Secondary | ICD-10-CM | POA: Diagnosis present

## 2017-06-29 DIAGNOSIS — K626 Ulcer of anus and rectum: Secondary | ICD-10-CM | POA: Diagnosis present

## 2017-06-29 DIAGNOSIS — K640 First degree hemorrhoids: Secondary | ICD-10-CM | POA: Diagnosis present

## 2017-06-29 DIAGNOSIS — N390 Urinary tract infection, site not specified: Secondary | ICD-10-CM | POA: Diagnosis not present

## 2017-06-29 DIAGNOSIS — R1030 Lower abdominal pain, unspecified: Secondary | ICD-10-CM | POA: Diagnosis not present

## 2017-06-29 DIAGNOSIS — K644 Residual hemorrhoidal skin tags: Secondary | ICD-10-CM | POA: Diagnosis present

## 2017-06-29 DIAGNOSIS — F329 Major depressive disorder, single episode, unspecified: Secondary | ICD-10-CM | POA: Diagnosis present

## 2017-06-29 DIAGNOSIS — T18128A Food in esophagus causing other injury, initial encounter: Secondary | ICD-10-CM | POA: Diagnosis present

## 2017-06-29 DIAGNOSIS — K2210 Ulcer of esophagus without bleeding: Secondary | ICD-10-CM | POA: Diagnosis present

## 2017-06-29 DIAGNOSIS — E7800 Pure hypercholesterolemia, unspecified: Secondary | ICD-10-CM | POA: Diagnosis present

## 2017-06-29 DIAGNOSIS — F419 Anxiety disorder, unspecified: Secondary | ICD-10-CM | POA: Diagnosis present

## 2017-06-29 DIAGNOSIS — K6289 Other specified diseases of anus and rectum: Secondary | ICD-10-CM | POA: Diagnosis present

## 2017-06-29 DIAGNOSIS — N3090 Cystitis, unspecified without hematuria: Principal | ICD-10-CM | POA: Diagnosis present

## 2017-06-29 DIAGNOSIS — K5641 Fecal impaction: Secondary | ICD-10-CM | POA: Diagnosis present

## 2017-06-29 DIAGNOSIS — F32A Depression, unspecified: Secondary | ICD-10-CM | POA: Diagnosis present

## 2017-06-29 DIAGNOSIS — K219 Gastro-esophageal reflux disease without esophagitis: Secondary | ICD-10-CM | POA: Diagnosis present

## 2017-06-29 DIAGNOSIS — R112 Nausea with vomiting, unspecified: Secondary | ICD-10-CM | POA: Diagnosis present

## 2017-06-29 DIAGNOSIS — I248 Other forms of acute ischemic heart disease: Secondary | ICD-10-CM | POA: Diagnosis present

## 2017-06-29 DIAGNOSIS — I110 Hypertensive heart disease with heart failure: Secondary | ICD-10-CM | POA: Diagnosis present

## 2017-06-29 DIAGNOSIS — K5730 Diverticulosis of large intestine without perforation or abscess without bleeding: Secondary | ICD-10-CM | POA: Diagnosis present

## 2017-06-29 DIAGNOSIS — Z7982 Long term (current) use of aspirin: Secondary | ICD-10-CM

## 2017-06-29 DIAGNOSIS — J449 Chronic obstructive pulmonary disease, unspecified: Secondary | ICD-10-CM | POA: Diagnosis present

## 2017-06-29 DIAGNOSIS — I5032 Chronic diastolic (congestive) heart failure: Secondary | ICD-10-CM | POA: Diagnosis not present

## 2017-06-29 DIAGNOSIS — Z87891 Personal history of nicotine dependence: Secondary | ICD-10-CM

## 2017-06-29 DIAGNOSIS — Z66 Do not resuscitate: Secondary | ICD-10-CM | POA: Diagnosis present

## 2017-06-29 DIAGNOSIS — E039 Hypothyroidism, unspecified: Secondary | ICD-10-CM | POA: Diagnosis present

## 2017-06-29 DIAGNOSIS — B3781 Candidal esophagitis: Secondary | ICD-10-CM | POA: Diagnosis present

## 2017-06-29 DIAGNOSIS — N3000 Acute cystitis without hematuria: Secondary | ICD-10-CM | POA: Diagnosis not present

## 2017-06-29 DIAGNOSIS — R1310 Dysphagia, unspecified: Secondary | ICD-10-CM | POA: Diagnosis present

## 2017-06-29 DIAGNOSIS — K449 Diaphragmatic hernia without obstruction or gangrene: Secondary | ICD-10-CM | POA: Diagnosis present

## 2017-06-29 LAB — URINALYSIS, ROUTINE W REFLEX MICROSCOPIC
Bilirubin Urine: NEGATIVE
Glucose, UA: NEGATIVE mg/dL
Hgb urine dipstick: NEGATIVE
KETONES UR: 5 mg/dL — AB
Leukocytes, UA: NEGATIVE
Nitrite: POSITIVE — AB
PH: 5 (ref 5.0–8.0)
PROTEIN: 30 mg/dL — AB
Specific Gravity, Urine: 1.017 (ref 1.005–1.030)

## 2017-06-29 LAB — CBC WITH DIFFERENTIAL/PLATELET
Basophils Absolute: 0.1 10*3/uL (ref 0.0–0.1)
Basophils Relative: 1 %
EOS PCT: 1 %
Eosinophils Absolute: 0.1 10*3/uL (ref 0.0–0.7)
HCT: 42.8 % (ref 36.0–46.0)
Hemoglobin: 13.8 g/dL (ref 12.0–15.0)
Lymphocytes Relative: 10 %
Lymphs Abs: 1.1 10*3/uL (ref 0.7–4.0)
MCH: 27.2 pg (ref 26.0–34.0)
MCHC: 32.2 g/dL (ref 30.0–36.0)
MCV: 84.4 fL (ref 78.0–100.0)
MONO ABS: 0.7 10*3/uL (ref 0.1–1.0)
MONOS PCT: 6 %
NEUTROS ABS: 9 10*3/uL — AB (ref 1.7–7.7)
Neutrophils Relative %: 82 %
PLATELETS: 657 10*3/uL — AB (ref 150–400)
RBC: 5.07 MIL/uL (ref 3.87–5.11)
RDW: 14.8 % (ref 11.5–15.5)
WBC: 10.9 10*3/uL — ABNORMAL HIGH (ref 4.0–10.5)

## 2017-06-29 LAB — COMPREHENSIVE METABOLIC PANEL
ALK PHOS: 61 U/L (ref 38–126)
ALT: 10 U/L — AB (ref 14–54)
AST: 16 U/L (ref 15–41)
Albumin: 3.9 g/dL (ref 3.5–5.0)
Anion gap: 8 (ref 5–15)
BUN: 22 mg/dL — ABNORMAL HIGH (ref 6–20)
CALCIUM: 11.1 mg/dL — AB (ref 8.9–10.3)
CO2: 24 mmol/L (ref 22–32)
CREATININE: 0.89 mg/dL (ref 0.44–1.00)
Chloride: 102 mmol/L (ref 101–111)
GFR calc Af Amer: >60 mL/min (ref >60)
GFR, EST NON AFRICAN AMERICAN: 55 mL/min — AB (ref >60)
Glucose, Bld: 114 mg/dL — ABNORMAL HIGH (ref 65–99)
Potassium: 4.5 mmol/L (ref 3.5–5.1)
Sodium: 134 mmol/L — ABNORMAL LOW (ref 135–145)
Total Bilirubin: 0.8 mg/dL (ref 0.3–1.2)
Total Protein: 7.8 g/dL (ref 6.5–8.1)

## 2017-06-29 LAB — I-STAT CHEM 8, ED
BUN: 21 mg/dL — ABNORMAL HIGH (ref 6–20)
Calcium, Ion: 1.42 mmol/L — ABNORMAL HIGH (ref 1.15–1.40)
Chloride: 103 mmol/L (ref 101–111)
Creatinine, Ser: 0.9 mg/dL (ref 0.44–1.00)
Glucose, Bld: 115 mg/dL — ABNORMAL HIGH (ref 65–99)
HCT: 44 % (ref 36.0–46.0)
Hemoglobin: 15 g/dL (ref 12.0–15.0)
Potassium: 4.4 mmol/L (ref 3.5–5.1)
Sodium: 136 mmol/L (ref 135–145)
TCO2: 24 mmol/L (ref 22–32)

## 2017-06-29 LAB — LIPASE, BLOOD: LIPASE: 26 U/L (ref 11–51)

## 2017-06-29 MED ORDER — ONDANSETRON HCL 4 MG/2ML IJ SOLN
4.0000 mg | Freq: Once | INTRAMUSCULAR | Status: AC
  Administered 2017-06-29: 4 mg via INTRAVENOUS
  Filled 2017-06-29: qty 2

## 2017-06-29 MED ORDER — FENTANYL CITRATE (PF) 100 MCG/2ML IJ SOLN
50.0000 ug | Freq: Once | INTRAMUSCULAR | Status: AC
  Administered 2017-06-29: 50 ug via INTRAVENOUS
  Filled 2017-06-29: qty 2

## 2017-06-29 MED ORDER — IOPAMIDOL (ISOVUE-300) INJECTION 61%
INTRAVENOUS | Status: AC
  Administered 2017-06-29: 100 mL via INTRAVENOUS
  Filled 2017-06-29: qty 100

## 2017-06-29 MED ORDER — DEXTROSE 5 % IV SOLN
1.0000 g | Freq: Once | INTRAVENOUS | Status: AC
  Administered 2017-06-29: 1 g via INTRAVENOUS
  Filled 2017-06-29: qty 10

## 2017-06-29 MED ORDER — SODIUM CHLORIDE 0.9 % IV SOLN
1000.0000 mL | Freq: Once | INTRAVENOUS | Status: AC
  Administered 2017-06-29: 1000 mL via INTRAVENOUS

## 2017-06-29 NOTE — ED Provider Notes (Signed)
[facility]-EMERGENCY DEPT Provider Note   CSN: [id number] Arrival date & time: 06/29/17  1711     History   Chief Complaint Chief Complaint  Patient presents with  . spits up after eating.    HPI Julie Francis is a 83 y.o. female who presents emergency department for nausea and vomiting.  The patient is somewhat confused this limits the history.  She does complain of lower abdominal pain, back pain, nausea and vomiting for the past day and a half.  She is unsure if it is because she is coughing.  She denies any fever or chills.  She is unable to currently characterize her pain.  Patient states that she lives in assisted living at [facility] and normally is able to take care of herself.  HPI  Past Medical History:  Diagnosis Date  . Arthritis   . Brain aneurysm 1999   no medical follow up needed  . COPD (chronic obstructive pulmonary disease) (HCC)   . High cholesterol   . Hypertension   . Overactive bladder   . Thyroid disease     Patient Active Problem List   Diagnosis Date Noted  . Overactive bladder 12/09/2012  . Depression 12/09/2012  . CAP (community acquired pneumonia) 09/15/2012  . Anorexia 09/13/2012  . Fall 09/12/2012  . Unspecified essential hypertension 09/12/2012  . Hyponatremia 09/12/2012  . Hyperkalemia 09/12/2012  . Unspecified constipation 09/12/2012  . Encephalopathy, toxic 09/11/2012  . Hypothyroidism 09/11/2012  . Glaucoma 09/11/2012  . Macular degeneration 09/11/2012    Past Surgical History:  Procedure Laterality Date  . APPENDECTOMY    . TONSILLECTOMY    . TUBAL LIGATION      OB History    No data available       Home Medications    Prior to Admission medications   Medication Sig Start Date End Date Taking? Authorizing Provider  acetaminophen (TYLENOL) 325 MG tablet Take 650 mg by mouth every 6 (six) hours as needed for mild pain.    [provider]  albuterol (PROVENTIL HFA;VENTOLIN HFA) 108 (90  BASE) MCG/ACT inhaler Inhale 2 puffs into the lungs daily. At 0900    [provider]  albuterol (PROVENTIL) (2.5 MG/3ML) 0.083% nebulizer solution Take 2.5 mg by nebulization 3 (three) times daily as needed for wheezing or shortness of breath.     [provider]  aspirin EC 81 MG tablet Take 81 mg by mouth every morning.    [provider]  buPROPion (WELLBUTRIN XL) 150 MG 24 hr tablet Take 150 mg by mouth every morning.    [provider]  cetirizine (ZYRTEC) 5 MG tablet Take 5 mg by mouth daily.     [provider]  clotrimazole (LOTRIMIN) 1 % cream Apply to affected area 2 times daily 03/21/14   Forcucci, Courtney, PA-C  Cranberry-Vitamin C (AZO CRANBERRY URINARY TRACT) 250-60 MG CAPS Take 1 capsule by mouth every morning.    [provider]  dorzolamide-timolol (COSOPT) 22.3-6.8 MG/ML ophthalmic solution Place 1 drop into both eyes 2 (two) times daily. 0900 and 1700    [provider]  furosemide (LASIX) 20 MG tablet Take 20 mg by mouth every morning.    [provider]  guaiFENesin (MUCINEX) 600 MG 12 hr tablet Take 600 mg by mouth 2 (two) times daily. 0900 and 2100    [provider]  guaifenesin (ROBITUSSIN) 100 MG/5ML syrup Take 600 mg by mouth at bedtime.     [provider]  hydrALAZINE (APRESOLINE) 10 MG tablet Take 10 mg by mouth 3 (three) times daily. 0900 1500 and 2100    [provider]  HYDROcodone-acetaminophen (NORCO/VICODIN) 5-325 MG per tablet Take 1-2 tablets by mouth every 4 (four) hours as needed for pain. 09/16/12   Alison Murray, MD  ipratropium-albuterol (DUONEB) 0.5-2.5 (3) MG/3ML SOLN Take 3 mLs by nebulization every 4 (four) hours as needed (breathing).    [provider]  ketoconazole (NIZORAL) 2 % cream Apply 1 application topically 2 (two) times daily as needed for irritation. Use under stomach folds    [provider]  latanoprost (XALATAN) 0.005 %  ophthalmic solution Place 1 drop into both eyes at bedtime.    [provider]  levothyroxine (SYNTHROID, LEVOTHROID) 50 MCG tablet Take 50 mcg by mouth daily before breakfast.    [provider]  lisinopril (PRINIVIL,ZESTRIL) 10 MG tablet Take 10 mg by mouth every morning.    [provider]  mineral oil external liquid Place 2 drops into both ears once a week. On Monday's    [provider]  omeprazole (PRILOSEC) 20 MG capsule Take 20 mg by mouth at bedtime.    [provider]  potassium chloride (K-DUR) 10 MEQ tablet Take 10 mEq by mouth every morning.    [provider]  sertraline (ZOLOFT) 50 MG tablet Take 50 mg by mouth every morning.    [provider]  tiotropium (SPIRIVA) 18 MCG inhalation capsule Place 18 mcg into inhaler and inhale daily.    [provider]    Family History No family history on file.  Social History Social History   Tobacco Use  . Smoking status: Former Smoker    Last attempt to quit: 09/11/1994    Years since quitting: 22.8  . Smokeless tobacco: Never Used  Substance Use Topics  . Alcohol use: No  . Drug use: No     Allergies   Patient has no known allergies.   Review of Systems Review of Systems  Unable to perform ROS: Mental status change     Physical Exam Updated Vital Signs BP (!) 169/84 (BP Location: Left Arm)   Pulse 89   Temp 98.1 F (36.7 C) (Oral)   Resp (!) 22   SpO2 94%   Physical Exam  Constitutional: She is oriented to person, place, and time. She appears well-developed and well-nourished. No distress.  HENT:  Head: Normocephalic and atraumatic.  Eyes: Conjunctivae are normal. No scleral icterus.  Neck: Normal range of motion.  Cardiovascular: Normal rate, regular rhythm and normal heart sounds. Exam reveals no gallop and no friction rub.  No murmur heard. Pulmonary/Chest: Effort normal and breath sounds normal. No respiratory distress.  Abdominal:  Soft. Bowel sounds are normal. She exhibits no distension and no mass. There is tenderness. There is no guarding.  Lower abdominal tenderness, no CVA tenderness  Neurological: She is alert and oriented to person, place, and time.  Skin: Skin is warm and dry. She is not diaphoretic.  Psychiatric: Her behavior is normal.  Nursing note and vitals reviewed.    ED Treatments / Results  Labs (all labs ordered are listed, but only abnormal results are displayed) Labs Reviewed  CBC WITH DIFFERENTIAL/PLATELET - Abnormal; Notable for the following components:      Result Value   WBC 10.9 (*)    Platelets 657 (*)    Neutro Abs 9.0 (*)    All other components within normal limits  COMPREHENSIVE METABOLIC PANEL - Abnormal; Notable for the following components:   Sodium 134 (*)    Glucose, Bld 114 (*)    BUN 22 (*)    Calcium 11.1 (*)    ALT 10 (*)    GFR calc non Af Amer 55 (*)    All other components within normal limits  I-STAT CHEM 8, ED - Abnormal; Notable for the following components:   BUN 21 (*)    Glucose, Bld 115 (*)    Calcium, Ion 1.42 (*)    All other components within normal limits  LIPASE, BLOOD  URINALYSIS, ROUTINE W REFLEX MICROSCOPIC    EKG  EKG Interpretation None       Radiology No results found.  Procedures Procedures (including critical care time)  Medications Ordered in ED Medications  ondansetron (ZOFRAN) injection 4 mg (4 mg Intravenous Given 06/29/17 1839)  0.9 %  sodium chloride infusion (1,000 mLs Intravenous New Bag/Given 06/29/17 1846)  fentaNYL (SUBLIMAZE) injection 50 mcg (50 mcg Intravenous Given 06/29/17 1839)     Initial Impression / Assessment and Plan / ED Course  I have reviewed the triage vital signs and the nursing notes.  Pertinent labs & imaging results that were available during my care of the patient were reviewed by me and considered in my medical decision making (see chart for details).  Clinical Course as of Jul 07 1517  Wynelle LinkSun  Jun 29, 2017  2102 Calcium: (!) 11.1 [AH]  2324 Patient with uti. Her sxs have improved. Her CT scan is pending   [AH]    Clinical Course User Index [AH] Arthor CaptainHarris, Caio Devera, PA-C   Patient iwht UTI/ N/V which has improved in the ED. Patient will be admitted .  Final Clinical Impressions(s) / ED Diagnoses   Final diagnoses:  Acute lower UTI    ED Discharge Orders    None       Arthor CaptainHarris, Reg Bircher, PA-C 07/07/17 1519    Bethann BerkshireZammit, Joseph, MD 07/08/17 [phone]

## 2017-06-29 NOTE — ED Triage Notes (Signed)
Per [facility] patient from [facility] for patient "spits up after eats" that has been going on for a while.  Clear sputum that patient coughing up.  Patient denies abd pain, n/v.   Patient could stand with assistance to get from wheelchair to EMS stretcher.  Vitals: 185/96, HR 84, R16, 98% on room air.

## 2017-06-29 NOTE — ED Notes (Signed)
ED Provider at bedside. 

## 2017-06-29 NOTE — H&P (Signed)
History and Physical    Julie Francis WUJ:[id number] DOB: 04-28-1923 DOA: 06/29/2017  Referring MD/NP/PA:   PCP: Florentina Jenny, MD   Patient coming from:  The patient is coming from assisted living facility.  At baseline, pt is partially dependent for most of ADL.  Chief Complaint: Nausea, vomiting, lower abdominal pain and increased urinary frequency  HPI: Julie Francis is a 83 y.o. female with medical history significant of hypertension, COPD, GERD, hypothyroidism, CHF, bradycardia aneurysm, depression, who presents with nausea, vomiting, lower abdominal pain and increased urinary frequency.  Patient states that she has been having nausea, vomiting and lower abdominal pain for almost 2 days. She vomited very little material occasionally. Her abdominal pain is located in the lower abdomen, constant, dull, moderate, nonradiating. She has increased urinary frequency, no dysuria or burning on urination. No fever or chills. Per her son, patient had mild epigastric versus lower chest pain earlier, which has resolved. She has mild dry cough and  SOB, which is at her baseline. No unilateral weakness.  ED Course: pt was found to have urinalysis positive for nitrite, WBC 10.9, electrolytes renal function okay, temperature normal, no tachycardia, oxygen saturation 2 L nasal cannula oxygen. Patient is admitted to telemetry bed as inpatient.  CT abdomen/pelvis showed: 1. Question circumferential rectal wall thickening which may represent proctitis or possibly rectal mass. Consider direct inspection/physical examination. No evidence of bowel obstruction. Moderate to large amount of distal sigmoid stool. 2. Gallbladder sludge versus cholelithiasis. No CT evidence of acute cholecystitis. 3. Mild circumferential bladder wall thickening of uncertain chronicity. Correlate with UTI/cystitis. 4. Moderate hiatal hernia 5. Punctate nonobstructing right renal calculus 6. Right lower lobe bronchiectasis and  scarring 7.  Aortic Atherosclerosis (ICD10-I70.0).  Review of Systems:   General: no fevers, chills, no body weight gain, has fatigue HEENT: no blurry vision, hearing changes or sore throat Respiratory: no dyspnea, coughing, wheezing CV: currenly no chest pain, no palpitations GI: has nausea, vomiting, abdominal pain, no diarrhea, constipation GU: no dysuria, burning on urination, increased urinary frequency, hematuria  Ext: no leg edema Neuro: no unilateral weakness, numbness, or tingling, no vision change or hearing loss Skin: no rash, no skin tear. MSK: No muscle spasm, no deformity, no limitation of range of movement in spin Heme: No easy bruising.  Travel history: No recent long distant travel.  Allergy: No Known Allergies  Past Medical History:  Diagnosis Date  . Arthritis   . Brain aneurysm 1999   no medical follow up needed  . COPD (chronic obstructive pulmonary disease) (HCC)   . High cholesterol   . Hypertension   . Overactive bladder   . Thyroid disease     Past Surgical History:  Procedure Laterality Date  . APPENDECTOMY    . TONSILLECTOMY    . TUBAL LIGATION      Social History:  reports that she quit smoking about 22 years ago. she has never used smokeless tobacco. She reports that she does not drink alcohol or use drugs.  Family History:  Family History  Problem Relation Age of Onset  . Cirrhosis Father      Prior to Admission medications   Medication Sig Start Date End Date Taking? Authorizing Provider  acetaminophen (TYLENOL) 325 MG tablet Take 650 mg by mouth every 6 (six) hours as needed for mild pain.    [provider]  albuterol (PROVENTIL HFA;VENTOLIN HFA) 108 (90 BASE) MCG/ACT inhaler Inhale 2 puffs into the lungs daily. At 0900    [provider]  albuterol (PROVENTIL) (2.5 MG/3ML) 0.083% nebulizer solution Take 2.5 mg by nebulization 3 (three) times daily as needed for wheezing or shortness of breath.     [provider]  aspirin EC 81 MG tablet Take 81 mg by mouth every morning.    [provider]  buPROPion (WELLBUTRIN XL) 150 MG 24 hr tablet Take 150 mg by mouth every morning.    [provider]  cetirizine (ZYRTEC) 5 MG tablet Take 5 mg by mouth daily.     [provider]  clotrimazole (LOTRIMIN) 1 % cream Apply to affected area 2 times daily 03/21/14   Forcucci, Courtney, PA-C  Cranberry-Vitamin C (AZO CRANBERRY URINARY TRACT) 250-60 MG CAPS Take 1 capsule by mouth every morning.    [provider]  dorzolamide-timolol (COSOPT) 22.3-6.8 MG/ML ophthalmic solution Place 1 drop into both eyes 2 (two) times daily. 0900 and 1700    [provider]  furosemide (LASIX) 20 MG tablet Take 20 mg by mouth every morning.    [provider]  guaiFENesin (MUCINEX) 600 MG 12 hr tablet Take 600 mg by mouth 2 (two) times daily. 0900 and 2100    [provider]  guaifenesin (ROBITUSSIN) 100 MG/5ML syrup Take 600 mg by mouth at bedtime.     [provider]  hydrALAZINE (APRESOLINE) 10 MG tablet Take 10 mg by mouth 3 (three) times daily. 0900 1500 and 2100    [provider]  HYDROcodone-acetaminophen (NORCO/VICODIN) 5-325 MG per tablet Take 1-2 tablets by mouth every 4 (four) hours as needed for pain. 09/16/12   Alison Murrayevine, Alma M, MD  ipratropium-albuterol (DUONEB) 0.5-2.5 (3) MG/3ML SOLN Take 3 mLs by nebulization every 4 (four) hours as needed (breathing).    [provider]  ketoconazole (NIZORAL) 2 % cream Apply 1 application topically 2 (two) times daily as needed for irritation. Use under stomach folds    [provider]  latanoprost (XALATAN) 0.005 % ophthalmic solution Place 1 drop into both eyes at bedtime.    [provider]  levothyroxine (SYNTHROID, LEVOTHROID) 50 MCG tablet Take 50 mcg by mouth daily before breakfast.    [provider]  lisinopril (PRINIVIL,ZESTRIL) 10 MG tablet Take 10 mg by  mouth every morning.    [provider]  mineral oil external liquid Place 2 drops into both ears once a week. On Monday's    [provider]  omeprazole (PRILOSEC) 20 MG capsule Take 20 mg by mouth at bedtime.    [provider]  potassium chloride (K-DUR) 10 MEQ tablet Take 10 mEq by mouth every morning.    [provider]  sertraline (ZOLOFT) 50 MG tablet Take 50 mg by mouth every morning.    [provider]  tiotropium (SPIRIVA) 18 MCG inhalation capsule Place 18 mcg into inhaler and inhale daily.    [provider]    Physical Exam: Vitals:   06/30/17 0000 06/30/17 0030 06/30/17 0100 06/30/17 0214  BP: (!) 137/91 (!) 155/80 (!) 147/67   Pulse: 77 79 73   Resp: 18 (!) 22 18   Temp:      TempSrc:      SpO2: 93% 97% 99%   Weight:    72.6 kg (160 lb)  Height:    5\' 3"  (1.6 m)   General: Not in acute distress HEENT:       Eyes: PERRL, EOMI, no scleral icterus.       ENT: No discharge from the  ears and nose, no pharynx injection, no tonsillar enlargement.        Neck: No JVD, no bruit, no mass felt. Heme: No neck lymph node enlargement. Cardiac: S1/S2, RRR, No murmurs, No gallops or rubs. Respiratory: No rales, wheezing, rhonchi or rubs. GI: Soft, nondistended, has tenderness in lower abdomen, no rebound pain, no organomegaly, BS present. GU: No hematuria Ext: No pitting leg edema bilaterally. 2+DP/PT pulse bilaterally. Musculoskeletal: No joint deformities, No joint redness or warmth, no limitation of ROM in spin. Skin: No rashes.  Neuro: Alert, oriented X3, cranial nerves II-XII grossly intact, moves all extremities normally. Psych: Patient is not psychotic, no suicidal or hemocidal ideation.  Labs on Admission: I have personally reviewed following labs and imaging studies  CBC: Recent Labs  Lab 06/29/17 1811 06/29/17 1845  WBC 10.9*  --   NEUTROABS 9.0*  --   HGB 13.8 15.0  HCT 42.8 44.0  MCV 84.4  --   PLT 657*   --    Basic Metabolic Panel: Recent Labs  Lab 06/29/17 1811 06/29/17 1845  NA 134* 136  K 4.5 4.4  CL 102 103  CO2 24  --   GLUCOSE 114* 115*  BUN 22* 21*  CREATININE 0.89 0.90  CALCIUM 11.1*  --    GFR: Estimated Creatinine Clearance: 38.1 mL/min (by C-G formula based on SCr of 0.9 mg/dL). Liver Function Tests: Recent Labs  Lab 06/29/17 1811  AST 16  ALT 10*  ALKPHOS 61  BILITOT 0.8  PROT 7.8  ALBUMIN 3.9   Recent Labs  Lab 06/29/17 1811  LIPASE 26   No results for input(s): AMMONIA in the last 168 hours. Coagulation Profile: No results for input(s): INR, PROTIME in the last 168 hours. Cardiac Enzymes: No results for input(s): CKTOTAL, CKMB, CKMBINDEX, TROPONINI in the last 168 hours. BNP (last 3 results) No results for input(s): PROBNP in the last 8760 hours. HbA1C: No results for input(s): HGBA1C in the last 72 hours. CBG: No results for input(s): GLUCAP in the last 168 hours. Lipid Profile: No results for input(s): CHOL, HDL, LDLCALC, TRIG, CHOLHDL, LDLDIRECT in the last 72 hours. Thyroid Function Tests: No results for input(s): TSH, T4TOTAL, FREET4, T3FREE, THYROIDAB in the last 72 hours. Anemia Panel: No results for input(s): VITAMINB12, FOLATE, FERRITIN, TIBC, IRON, RETICCTPCT in the last 72 hours. Urine analysis:    Component Value Date/Time   COLORURINE YELLOW 06/29/2017 1811   APPEARANCEUR HAZY (A) 06/29/2017 1811   LABSPEC 1.017 06/29/2017 1811   PHURINE 5.0 06/29/2017 1811   GLUCOSEU NEGATIVE 06/29/2017 1811   HGBUR NEGATIVE 06/29/2017 1811   BILIRUBINUR NEGATIVE 06/29/2017 1811   KETONESUR 5 (A) 06/29/2017 1811   PROTEINUR 30 (A) 06/29/2017 1811   UROBILINOGEN 0.2 04/16/2014 1053   NITRITE POSITIVE (A) 06/29/2017 1811   LEUKOCYTESUR NEGATIVE 06/29/2017 1811   Sepsis Labs: @LABRCNTIP (procalcitonin:4,lacticidven:4) )No results found for this or any previous visit (from the past 240 hour(s)).   Radiological Exams on Admission: Ct  Abdomen Pelvis W Contrast  Result Date: 06/29/2017 CLINICAL DATA:  83 year old female with abdominal and pelvic distention. EXAM: CT ABDOMEN AND PELVIS WITH CONTRAST TECHNIQUE: Multidetector CT imaging of the abdomen and pelvis was performed using the standard protocol following bolus administration of intravenous contrast. CONTRAST:  100 cc intravenous Isovue-300 COMPARISON:  None. FINDINGS: Lower chest: No acute abnormality. Mild right lower lobe cylindrical bronchiectasis and scarring identified. Hepatobiliary: The liver is unremarkable except for hepatic cysts. Gallbladder sludge versus cholelithiasis noted without CT evidence  of acute cholecystitis. There is no evidence of biliary dilatation. Pancreas: Unremarkable Spleen: Unremarkable Adrenals/Urinary Tract: Mild circumferential bladder wall thickening is nonspecific but may represent cystitis. A punctate nonobstructing right upper pole renal calculus noted. No obstructing urinary calculi are identified. The adrenal glands are unremarkable. Stomach/Bowel: A moderate hiatal hernia is noted. There is no evidence of bowel obstruction. Colonic diverticulosis noted without evidence of diverticulitis. A moderate to large amount of stool within the distal sigmoid colon noted. There may be circumferential wall thickening of the rectum present. Vascular/Lymphatic: Aortic atherosclerosis. No enlarged abdominal or pelvic lymph nodes. Reproductive: Uterus and bilateral adnexa are unremarkable. Other: No ascites, abscess or pneumoperitoneum. Musculoskeletal: No acute bony abnormalities. No suspicious bony lesions noted. IMPRESSION: 1. Question circumferential rectal wall thickening which may represent proctitis or possibly rectal mass. Consider direct inspection/physical examination. No evidence of bowel obstruction. Moderate to large amount of distal sigmoid stool. 2. Gallbladder sludge versus cholelithiasis. No CT evidence of acute cholecystitis. 3. Mild  circumferential bladder wall thickening of uncertain chronicity. Correlate with UTI/cystitis. 4. Moderate hiatal hernia 5. Punctate nonobstructing right renal calculus 6. Right lower lobe bronchiectasis and scarring 7.  Aortic Atherosclerosis (ICD10-I70.0). Electronically Signed   By: Harmon PierJeffrey  Hu M.D.   On: 06/29/2017 23:33     EKG:  Not done in ED, will get one.   Assessment/Plan Principal Problem:   Acute lower UTI Active Problems:   Hypothyroidism   Essential hypertension   Depression   GERD (gastroesophageal reflux disease)   Abdominal pain   Hypercalcemia   COPD (chronic obstructive pulmonary disease) (HCC)   Diastolic CHF (HCC)   UTI (urinary tract infection)   Acute lower UTI: UA positive for nitrite. -will admit to tele bed as inpt -Zosyn VI (pt received one dose of Rocephin, will switch to Zosyn to cover possible proctitis) -Follow up blood cultures and urine culture  Abdominal pain: UTI may partially explain her lower abdominal pain, but CT scan showed possible proctitis, but cannot completely rule out mass. -Zofran for Nausea, -When necessary morphine for pain -On Zosyn -May need to consult GI for scope  Hypercalcemia: Calcium 11.1. Mental status normal. Unclear etiology. May be due to dehydration. If patient has persistent hypercalcemia, may need to workup for malignancy. -IV fluid: normal saline 100 mL/h -Check PTH  Hypothyroidism: Last TSH was 1.819 -Continue home Synthroid  HTN:  -Continue home medications: Hydralazine -IV hydralazine prn  GERD: -Protonix  Copd: stable -prn albuterol nebs  Chronic dCHF: Per patient's son, patient has CHF, but does not know which type of CHF. No 2-D echo on record. Given long history of hypertension, patient may have diastolic CHF. No leg edema or JVD. CHF is compensated. -Hold Lasix -Check a BNP  Depression and anxiety: Stable, no suicidal or homicidal ideations. -Continue home medications    DVT ppx:  SQ  Lovenox Code Status: DNR (I discussed with patient in the presence of her son, and explained the meaning of CODE STATUS. Patient wants to be DNR) Family Communication:  Yes, patient's son at bed side Disposition Plan:  Anticipate discharge back to previous assisted living facility Consults called:  none Admission status:  Inpatient/tele     Date of Service 06/30/2017    Lorretta HarpXilin Ciera Beckum [facility] Pager [phone]  If 7PM-7AM, please contact night-coverage [email or web address] Password [email or web address] 06/30/2017, 2:18 AM

## 2017-06-30 ENCOUNTER — Encounter (HOSPITAL_COMMUNITY): Payer: Self-pay | Admitting: Internal Medicine

## 2017-06-30 DIAGNOSIS — I5030 Unspecified diastolic (congestive) heart failure: Secondary | ICD-10-CM | POA: Diagnosis present

## 2017-06-30 DIAGNOSIS — K222 Esophageal obstruction: Secondary | ICD-10-CM | POA: Diagnosis present

## 2017-06-30 DIAGNOSIS — J449 Chronic obstructive pulmonary disease, unspecified: Secondary | ICD-10-CM | POA: Diagnosis present

## 2017-06-30 DIAGNOSIS — E039 Hypothyroidism, unspecified: Secondary | ICD-10-CM | POA: Diagnosis present

## 2017-06-30 DIAGNOSIS — N3000 Acute cystitis without hematuria: Secondary | ICD-10-CM | POA: Diagnosis not present

## 2017-06-30 DIAGNOSIS — I248 Other forms of acute ischemic heart disease: Secondary | ICD-10-CM | POA: Diagnosis present

## 2017-06-30 DIAGNOSIS — F329 Major depressive disorder, single episode, unspecified: Secondary | ICD-10-CM

## 2017-06-30 DIAGNOSIS — K5730 Diverticulosis of large intestine without perforation or abscess without bleeding: Secondary | ICD-10-CM | POA: Diagnosis present

## 2017-06-30 DIAGNOSIS — K644 Residual hemorrhoidal skin tags: Secondary | ICD-10-CM | POA: Diagnosis present

## 2017-06-30 DIAGNOSIS — F419 Anxiety disorder, unspecified: Secondary | ICD-10-CM | POA: Diagnosis present

## 2017-06-30 DIAGNOSIS — N390 Urinary tract infection, site not specified: Secondary | ICD-10-CM | POA: Diagnosis present

## 2017-06-30 DIAGNOSIS — N3281 Overactive bladder: Secondary | ICD-10-CM | POA: Diagnosis present

## 2017-06-30 DIAGNOSIS — K2210 Ulcer of esophagus without bleeding: Secondary | ICD-10-CM | POA: Diagnosis present

## 2017-06-30 DIAGNOSIS — K449 Diaphragmatic hernia without obstruction or gangrene: Secondary | ICD-10-CM | POA: Diagnosis present

## 2017-06-30 DIAGNOSIS — K2900 Acute gastritis without bleeding: Secondary | ICD-10-CM | POA: Diagnosis present

## 2017-06-30 DIAGNOSIS — Z87891 Personal history of nicotine dependence: Secondary | ICD-10-CM | POA: Diagnosis not present

## 2017-06-30 DIAGNOSIS — B3781 Candidal esophagitis: Secondary | ICD-10-CM | POA: Diagnosis present

## 2017-06-30 DIAGNOSIS — I5032 Chronic diastolic (congestive) heart failure: Secondary | ICD-10-CM | POA: Diagnosis present

## 2017-06-30 DIAGNOSIS — N3090 Cystitis, unspecified without hematuria: Secondary | ICD-10-CM | POA: Diagnosis present

## 2017-06-30 DIAGNOSIS — K640 First degree hemorrhoids: Secondary | ICD-10-CM | POA: Diagnosis present

## 2017-06-30 DIAGNOSIS — J410 Simple chronic bronchitis: Secondary | ICD-10-CM | POA: Diagnosis not present

## 2017-06-30 DIAGNOSIS — R1030 Lower abdominal pain, unspecified: Secondary | ICD-10-CM | POA: Diagnosis not present

## 2017-06-30 DIAGNOSIS — I110 Hypertensive heart disease with heart failure: Secondary | ICD-10-CM | POA: Diagnosis present

## 2017-06-30 DIAGNOSIS — Z66 Do not resuscitate: Secondary | ICD-10-CM | POA: Diagnosis present

## 2017-06-30 DIAGNOSIS — E7800 Pure hypercholesterolemia, unspecified: Secondary | ICD-10-CM | POA: Diagnosis present

## 2017-06-30 DIAGNOSIS — R1010 Upper abdominal pain, unspecified: Secondary | ICD-10-CM | POA: Diagnosis not present

## 2017-06-30 DIAGNOSIS — I10 Essential (primary) hypertension: Secondary | ICD-10-CM | POA: Diagnosis not present

## 2017-06-30 DIAGNOSIS — T18128A Food in esophagus causing other injury, initial encounter: Secondary | ICD-10-CM | POA: Diagnosis present

## 2017-06-30 DIAGNOSIS — K219 Gastro-esophageal reflux disease without esophagitis: Secondary | ICD-10-CM | POA: Diagnosis present

## 2017-06-30 DIAGNOSIS — K626 Ulcer of anus and rectum: Secondary | ICD-10-CM | POA: Diagnosis present

## 2017-06-30 LAB — TROPONIN I
Troponin I: <0.03 ng/mL (ref <0.03)
Troponin I: 0.06 ng/mL (ref <0.03)

## 2017-06-30 LAB — CBG MONITORING, ED: GLUCOSE-CAPILLARY: 80 mg/dL (ref 65–99)

## 2017-06-30 LAB — BRAIN NATRIURETIC PEPTIDE: B NATRIURETIC PEPTIDE 5: 152.4 pg/mL — AB (ref 0.0–100.0)

## 2017-06-30 MED ORDER — METOPROLOL TARTRATE 25 MG PO TABS
12.5000 mg | ORAL_TABLET | Freq: Two times a day (BID) | ORAL | Status: DC
  Administered 2017-06-30: 12.5 mg via ORAL
  Filled 2017-06-30: qty 1

## 2017-06-30 MED ORDER — ACETAMINOPHEN 650 MG RE SUPP: 650.0000 mg | Freq: Four times a day (QID) | RECTAL | Status: DC | PRN

## 2017-06-30 MED ORDER — MUSCLE RUB 10-15 % EX CREA
TOPICAL_CREAM | Freq: Every day | CUTANEOUS | Status: DC
  Administered 2017-07-01: 22:00:00 via TOPICAL
  Administered 2017-07-02 – 2017-07-03 (×2): 1 via TOPICAL
  Filled 2017-06-30: qty 85

## 2017-06-30 MED ORDER — LATANOPROST 0.005 % OP SOLN
1.0000 [drp] | Freq: Every day | OPHTHALMIC | Status: DC
  Administered 2017-07-01 – 2017-07-03 (×4): 1 [drp] via OPHTHALMIC
  Filled 2017-06-30 (×2): qty 2.5

## 2017-06-30 MED ORDER — ESCITALOPRAM OXALATE 10 MG PO TABS
10.0000 mg | ORAL_TABLET | Freq: Every day | ORAL | Status: DC
  Administered 2017-07-02 – 2017-07-04 (×3): 10 mg via ORAL
  Filled 2017-06-30 (×3): qty 1

## 2017-06-30 MED ORDER — HYDRALAZINE HCL 10 MG PO TABS
10.0000 mg | ORAL_TABLET | ORAL | Status: DC
  Administered 2017-06-30: 10 mg via ORAL
  Filled 2017-06-30 (×2): qty 1

## 2017-06-30 MED ORDER — ONDANSETRON HCL 4 MG/2ML IJ SOLN
4.0000 mg | Freq: Four times a day (QID) | INTRAMUSCULAR | Status: DC | PRN
  Administered 2017-06-30 (×2): 4 mg via INTRAVENOUS
  Filled 2017-06-30 (×2): qty 2

## 2017-06-30 MED ORDER — IPRATROPIUM-ALBUTEROL 0.5-2.5 (3) MG/3ML IN SOLN
3.0000 mL | RESPIRATORY_TRACT | Status: DC | PRN
  Administered 2017-07-03: 3 mL via RESPIRATORY_TRACT
  Filled 2017-06-30: qty 3

## 2017-06-30 MED ORDER — TROLAMINE SALICYLATE 10 % EX CREA: 1.0000 "application " | TOPICAL_CREAM | Freq: Every day | CUTANEOUS | Status: DC

## 2017-06-30 MED ORDER — PANTOPRAZOLE SODIUM 40 MG PO TBEC
40.0000 mg | DELAYED_RELEASE_TABLET | Freq: Every day | ORAL | Status: DC
  Administered 2017-06-30: 40 mg via ORAL
  Filled 2017-06-30: qty 1

## 2017-06-30 MED ORDER — BUPROPION HCL ER (XL) 150 MG PO TB24
150.0000 mg | ORAL_TABLET | Freq: Every day | ORAL | Status: DC
  Administered 2017-07-02 – 2017-07-04 (×3): 150 mg via ORAL
  Filled 2017-06-30 (×3): qty 1

## 2017-06-30 MED ORDER — LEVOTHYROXINE SODIUM 50 MCG PO TABS
50.0000 ug | ORAL_TABLET | Freq: Every day | ORAL | Status: DC
  Administered 2017-07-02 – 2017-07-04 (×3): 50 ug via ORAL
  Filled 2017-06-30 (×3): qty 1

## 2017-06-30 MED ORDER — DORZOLAMIDE HCL-TIMOLOL MAL 2-0.5 % OP SOLN
1.0000 [drp] | OPHTHALMIC | Status: DC
  Administered 2017-07-02 – 2017-07-04 (×4): 1 [drp] via OPHTHALMIC
  Filled 2017-06-30: qty 10

## 2017-06-30 MED ORDER — PIPERACILLIN-TAZOBACTAM 3.375 G IVPB
3.3750 g | Freq: Three times a day (TID) | INTRAVENOUS | Status: DC
  Administered 2017-06-30 – 2017-07-02 (×8): 3.375 g via INTRAVENOUS
  Filled 2017-06-30 (×11): qty 50

## 2017-06-30 MED ORDER — METOPROLOL TARTRATE 5 MG/5ML IV SOLN
2.5000 mg | Freq: Three times a day (TID) | INTRAVENOUS | Status: DC
Start: 2017-06-30 — End: 2017-07-02
  Administered 2017-06-30 – 2017-07-02 (×6): 2.5 mg via INTRAVENOUS
  Filled 2017-06-30 (×6): qty 5

## 2017-06-30 MED ORDER — SODIUM CHLORIDE 0.9 % IV SOLN
1000.0000 mL | Freq: Once | INTRAVENOUS | Status: AC
  Administered 2017-06-30: 1000 mL via INTRAVENOUS

## 2017-06-30 MED ORDER — ACETAMINOPHEN 500 MG PO TABS
500.0000 mg | ORAL_TABLET | Freq: Three times a day (TID) | ORAL | Status: DC
  Administered 2017-07-02 – 2017-07-04 (×6): 500 mg via ORAL
  Filled 2017-06-30 (×7): qty 1

## 2017-06-30 MED ORDER — MORPHINE SULFATE (PF) 4 MG/ML IV SOLN
1.0000 mg | INTRAVENOUS | Status: DC | PRN
  Administered 2017-06-30: 1 mg via INTRAVENOUS
  Filled 2017-06-30: qty 1

## 2017-06-30 MED ORDER — ENOXAPARIN SODIUM 40 MG/0.4ML ~~LOC~~ SOLN
40.0000 mg | Freq: Every day | SUBCUTANEOUS | Status: DC
  Administered 2017-07-02 – 2017-07-04 (×3): 40 mg via SUBCUTANEOUS
  Filled 2017-06-30 (×4): qty 0.4

## 2017-06-30 MED ORDER — MONTELUKAST SODIUM 10 MG PO TABS
10.0000 mg | ORAL_TABLET | Freq: Every day | ORAL | Status: DC
  Administered 2017-07-02 – 2017-07-03 (×2): 10 mg via ORAL
  Filled 2017-06-30 (×3): qty 1

## 2017-06-30 MED ORDER — ALBUTEROL SULFATE (2.5 MG/3ML) 0.083% IN NEBU: 2.5000 mg | INHALATION_SOLUTION | RESPIRATORY_TRACT | Status: DC | PRN

## 2017-06-30 MED ORDER — ASPIRIN EC 81 MG PO TBEC
81.0000 mg | DELAYED_RELEASE_TABLET | Freq: Every day | ORAL | Status: DC
  Administered 2017-07-02 – 2017-07-03 (×2): 81 mg via ORAL
  Filled 2017-06-30 (×3): qty 1

## 2017-06-30 MED ORDER — HYDRALAZINE HCL 20 MG/ML IJ SOLN
5.0000 mg | INTRAMUSCULAR | Status: DC | PRN
  Administered 2017-06-30 (×2): 5 mg via INTRAVENOUS
  Filled 2017-06-30 (×2): qty 1

## 2017-06-30 MED ORDER — ZOLPIDEM TARTRATE 5 MG PO TABS: 5.0000 mg | ORAL_TABLET | Freq: Every evening | ORAL | Status: DC | PRN

## 2017-06-30 MED ORDER — ONDANSETRON HCL 4 MG PO TABS: 4.0000 mg | ORAL_TABLET | Freq: Four times a day (QID) | ORAL | Status: DC | PRN

## 2017-06-30 MED ORDER — DEXTROSE 5 % IV SOLN: 1.0000 g | Freq: Once | INTRAVENOUS | Status: DC

## 2017-06-30 MED ORDER — SODIUM CHLORIDE 0.9 % IV SOLN
INTRAVENOUS | Status: DC
  Administered 2017-06-30: 17:00:00 via INTRAVENOUS

## 2017-06-30 MED ORDER — ACETAMINOPHEN 325 MG PO TABS: 650.0000 mg | ORAL_TABLET | Freq: Four times a day (QID) | ORAL | Status: DC | PRN

## 2017-06-30 NOTE — Consult Note (Signed)
Referring Provider: Dr. Lama Primary Care Physician:  Dasanayaka, Gayani Y, MD Primary Gastroenterologist:  Unassigned  Reason for Consultation:  Abdominal pain; Abnormal CT; N/V  HPI: Julie Francis is a 83 y.o. female with 2 days of recurrent regurgitation during PO intake with N/V and abdominal pain. Abdominal pain is lower and constant. Denies heartburn. Denies rectal bleeding or melena. She thinks she has been having a BM once a day. Son at bedside. Has not had a colonoscopy in the past. CT showed circumferential rectal wall thickening without bowel obstruction. Large amount of stool proximal to the rectum in the sigmoid colon. On IV Abx for UTI.  Past Medical History:  Diagnosis Date  . Arthritis   . Brain aneurysm 1999   no medical follow up needed  . COPD (chronic obstructive pulmonary disease) (HCC)   . High cholesterol   . Hypertension   . Overactive bladder   . Thyroid disease     Past Surgical History:  Procedure Laterality Date  . APPENDECTOMY    . TONSILLECTOMY    . TUBAL LIGATION      Prior to Admission medications   Medication Sig Start Date End Date Taking? Authorizing Provider  acetaminophen (TYLENOL) 500 MG tablet Take 500 mg by mouth 3 (three) times daily.   Yes [provider]  albuterol (PROVENTIL) (2.5 MG/3ML) 0.083% nebulizer solution Take 2.5 mg by nebulization 3 (three) times daily as needed for wheezing or shortness of breath.    Yes [provider]  aspirin EC 81 MG tablet Take 81 mg by mouth at bedtime.    Yes [provider]  buPROPion (WELLBUTRIN XL) 150 MG 24 hr tablet Take 150 mg by mouth every morning.   Yes [provider]  Cholecalciferol (VITAMIN D) 2000 units CAPS Take 2,000 Units by mouth daily.   Yes [provider]  Cranberry-Vitamin C (AZO CRANBERRY URINARY TRACT) 250-60 MG CAPS Take 1 capsule by mouth every morning.   Yes [provider]  dorzolamide-timolol (COSOPT) 22.3-6.8 MG/ML  ophthalmic solution Place 1 drop into both eyes 2 (two) times daily. 0900 and 1700   Yes [provider]  escitalopram (LEXAPRO) 10 MG tablet Take 10 mg by mouth daily.   Yes [provider]  guaiFENesin (MUCINEX) 600 MG 12 hr tablet Take 600 mg by mouth every 12 (twelve) hours as needed for cough or to loosen phlegm.    Yes [provider]  hydrALAZINE (APRESOLINE) 10 MG tablet Take 10 mg by mouth 3 (three) times daily. 0900 1500 and 2100   Yes [provider]  ipratropium-albuterol (DUONEB) 0.5-2.5 (3) MG/3ML SOLN Take 3 mLs by nebulization every 4 (four) hours as needed (breathing).   Yes [provider]  latanoprost (XALATAN) 0.005 % ophthalmic solution Place 1 drop into both eyes at bedtime.   Yes [provider]  levothyroxine (SYNTHROID, LEVOTHROID) 50 MCG tablet Take 50 mcg by mouth daily before breakfast.   Yes [provider]  loperamide (ANTI-DIARRHEAL) 2 MG tablet Take 2 mg by mouth 4 (four) times daily as needed for diarrhea or loose stools.   Yes [provider]  montelukast (SINGULAIR) 10 MG tablet Take 10 mg by mouth at bedtime.   Yes [provider]  omeprazole (PRILOSEC) 20 MG capsule Take 20 mg by mouth daily after breakfast.    Yes [provider]  oxybutynin (DITROPAN-XL) 5 MG 24 hr tablet Take 5 mg by mouth at bedtime.   Yes [provider]    tiotropium (SPIRIVA) 18 MCG inhalation capsule Place 18 mcg into inhaler and inhale daily.   Yes [provider]  trolamine salicylate (ASPERCREME) 10 % cream Apply 1 application topically at bedtime. Apply topically to legs   Yes [provider]  clotrimazole (LOTRIMIN) 1 % cream Apply to affected area 2 times daily Patient not taking: Reported on 06/30/2017 03/21/14   Forcucci, Courtney, PA-C    Scheduled Meds: . enoxaparin (LOVENOX) injection  40 mg Subcutaneous Daily   Continuous Infusions: . piperacillin-tazobactam  (ZOSYN)  IV Stopped (06/30/17 0800)   PRN Meds:.acetaminophen **OR** acetaminophen, albuterol, hydrALAZINE, morphine injection, ondansetron **OR** ondansetron (ZOFRAN) IV, zolpidem  Allergies as of 06/29/2017  . (No Known Allergies)    Family History  Problem Relation Age of Onset  . Cirrhosis Father     Social History   Socioeconomic History  . Marital status: Legally Separated    Spouse name: Not on file  . Number of children: Not on file  . Years of education: Not on file  . Highest education level: Not on file  Social Needs  . Financial resource strain: Not on file  . Food insecurity - worry: Not on file  . Food insecurity - inability: Not on file  . Transportation needs - medical: Not on file  . Transportation needs - non-medical: Not on file  Occupational History  . Not on file  Tobacco Use  . Smoking status: Former Smoker    Last attempt to quit: 09/11/1994    Years since quitting: 22.8  . Smokeless tobacco: Never Used  Substance and Sexual Activity  . Alcohol use: No  . Drug use: No  . Sexual activity: Not on file  Other Topics Concern  . Not on file  Social History Narrative  . Not on file    Review of Systems: All negative except as stated above in HPI.  Physical Exam: Vital signs: Vitals:   06/30/17 1003 06/30/17 1100  BP: (!) 151/66 (!) 173/94  Pulse: 65 68  Resp: 19 (!) 23  Temp:    SpO2: 92% 97%  T 98.1   General:   Lethargic, Elderly, frail, Well-developed, well-nourished, mild acute distress Head: normocephalic, atraumatic Eyes: anicteric sclera ENT: oropharynx clear, poor dentition Neck: supple, nontender Lungs:  Coarse breath sounds  Heart:  Regular rate and rhythm; no murmurs, clicks, rubs,  or gallops. Abdomen: diffuse tenderness with guarding, mild distention, +BS  Rectal:  Deferred Ext: LE tender bilaterally, no edema  GI:  Lab Results: Recent Labs    06/29/17 1811 06/29/17 1845  WBC 10.9*  --   HGB 13.8 15.0  HCT 42.8  44.0  PLT 657*  --    BMET Recent Labs    06/29/17 1811 06/29/17 1845  NA 134* 136  K 4.5 4.4  CL 102 103  CO2 24  --   GLUCOSE 114* 115*  BUN 22* 21*  CREATININE 0.89 0.90  CALCIUM 11.1*  --    LFT Recent Labs    06/29/17 1811  PROT 7.8  ALBUMIN 3.9  AST 16  ALT 10*  ALKPHOS 61  BILITOT 0.8   PT/INR No results for input(s): LABPROT, INR in the last 72 hours.   Studies/Results: Ct Abdomen Pelvis W Contrast  Result Date: 06/29/2017 CLINICAL DATA:  83-year-old female with abdominal and pelvic distention. EXAM: CT ABDOMEN AND PELVIS WITH CONTRAST TECHNIQUE: Multidetector CT imaging of the abdomen and pelvis was performed using the standard protocol following bolus administration of intravenous contrast.   CONTRAST:  100 cc intravenous Isovue-300 COMPARISON:  None. FINDINGS: Lower chest: No acute abnormality. Mild right lower lobe cylindrical bronchiectasis and scarring identified. Hepatobiliary: The liver is unremarkable except for hepatic cysts. Gallbladder sludge versus cholelithiasis noted without CT evidence of acute cholecystitis. There is no evidence of biliary dilatation. Pancreas: Unremarkable Spleen: Unremarkable Adrenals/Urinary Tract: Mild circumferential bladder wall thickening is nonspecific but may represent cystitis. A punctate nonobstructing right upper pole renal calculus noted. No obstructing urinary calculi are identified. The adrenal glands are unremarkable. Stomach/Bowel: A moderate hiatal hernia is noted. There is no evidence of bowel obstruction. Colonic diverticulosis noted without evidence of diverticulitis. A moderate to large amount of stool within the distal sigmoid colon noted. There may be circumferential wall thickening of the rectum present. Vascular/Lymphatic: Aortic atherosclerosis. No enlarged abdominal or pelvic lymph nodes. Reproductive: Uterus and bilateral adnexa are unremarkable. Other: No ascites, abscess or pneumoperitoneum. Musculoskeletal: No  acute bony abnormalities. No suspicious bony lesions noted. IMPRESSION: 1. Question circumferential rectal wall thickening which may represent proctitis or possibly rectal mass. Consider direct inspection/physical examination. No evidence of bowel obstruction. Moderate to large amount of distal sigmoid stool. 2. Gallbladder sludge versus cholelithiasis. No CT evidence of acute cholecystitis. 3. Mild circumferential bladder wall thickening of uncertain chronicity. Correlate with UTI/cystitis. 4. Moderate hiatal hernia 5. Punctate nonobstructing right renal calculus 6. Right lower lobe bronchiectasis and scarring 7.  Aortic Atherosclerosis (ICD10-I70.0). Electronically Signed   By: Jeffrey  Hu M.D.   On: 06/29/2017 23:33    Impression/Plan: 83 yo with lower abdominal pain and N/V with CT findings concerning for a rectal mass in need of a flex sig. Will also do an EGD due to the recurrent regurgitation/N/V. Clear liquid diet. NPO p MN. Risks/benefits of EGD/flex sig discussed with patient and her son and they agree to proceed.    LOS: 0 days   Teneka Malmberg C.  06/30/2017, 11:50 AM  Pager [phone]  AFTER 5 pm or on weekends please call [phone]  

## 2017-06-30 NOTE — Progress Notes (Addendum)
PHARMACY NOTE -  Zosyn  Pharmacy has been assisting with dosing of Zosyn for UTI vs Proctitis. Dosage remains stable at 3.375 g IV given every 8 hrs by 4-hr infusion, and need for further dosage adjustment appears unlikely at present.    Will sign off at this time.  Please reconsult if a change in clinical status warrants re-evaluation of dosage. Pharmacy will follow peripherally for LOT, culture results, and renal adjustments.  Bernadene Personrew Lamari Youngers, PharmD, BCPS Pager: [phone] 06/30/2017, 12:42 PM

## 2017-06-30 NOTE — Progress Notes (Signed)
Pharmacy Antibiotic Note  Barbie Bannerdelaide Laitinen is a 83 y.o. female admitted on 06/29/2017 with UTI vs Proctits.  Pharmacy has been consulted for zosyn dosing.  Plan: Zosyn 3.375g IV q8h (4 hour infusion).  Height: 5\' 3"  (160 cm) Weight: 160 lb (72.6 kg) IBW/kg (Calculated) : 52.4  Temp (24hrs), Avg:98.1 F (36.7 C), Min:98.1 F (36.7 C), Max:98.1 F (36.7 C)  Recent Labs  Lab 06/29/17 1811 06/29/17 1845  WBC 10.9*  --   CREATININE 0.89 0.90    Estimated Creatinine Clearance: 38.1 mL/min (by C-G formula based on SCr of 0.9 mg/dL).    No Known Allergies  Antimicrobials this admission: 12/2 rocephin >> x1 ED 12/3 zosyn >>   Dose adjustments this admission:   Microbiology results:  BCx:   UCx:    Sputum:    MRSA PCR:   Thank you for allowing pharmacy to be a part of this patient's care.  Lorenza EvangelistGreen, Erion Hermans R 06/30/2017 2:15 AM

## 2017-06-30 NOTE — ED Notes (Signed)
GI Provider at bedside. 

## 2017-06-30 NOTE — Progress Notes (Signed)
[facility]  PROGRESS NOTE  Julie Francis RUE:[id number]:[id number] DOB: [birth year] DOA: 06/29/2017 PCP: Angela Coxasanayaka, Gayani Y, MD   Brief HPI:  83 y.o. female with medical history significant of hypertension, COPD, GERD, hypothyroidism, CHF, bradycardia aneurysm, depression, who presents with nausea, vomiting, lower abdominal pain and increased urinary frequency      Subjective   Patient denies dysuria at this time. Complains of back pain due to  position of the bed in the ED.   Assessment/Plan:     1. UTI-UA positive for nitrite, patient started on Zosyn. Follow urine culture results. 2. Abdominal pain-likely combination of UTI and? Proctitis. Continue IV Zosyn. Zofran when necessary for nausea and vomiting. 3. Proctitis versus rectal mass- patient will need flexible sigmoidoscopy, will consult GI for further evaluation and management. 4. Hypercalcemia- acid was 11.1, PTH ordered. Concern for malignancy. Continue IV fluids. Check calcium level in a.m. 5. Hypothyroidism-continue Synthroid 6. Hypertension- blood pressure stable, continue hydralazine 7. Depression-continue with Wellbutrin, Lexapro    DVT prophylaxis: Lovenox  Code Status: DO NOT RESUSCITATE  Family Communication: Discussed with patient's son at bedside   Disposition Plan: Likely skilled facility in next 2-3 days   Consultants:  Gastroenterology  Procedures:  None  Continuous infusions . piperacillin-tazobactam (ZOSYN)  IV 3.375 g (06/30/17 1251)      Antibiotics:   Anti-infectives (From admission, onward)   Start     Dose/Rate Route Frequency Ordered Stop   06/30/17 2300  cefTRIAXone (ROCEPHIN) 1 g in dextrose 5 % 50 mL IVPB  Status:  Discontinued     1 g 100 mL/hr over 30 Minutes Intravenous  Once 06/30/17 0016 06/30/17 0208   06/30/17 0400  piperacillin-tazobactam (ZOSYN) IVPB 3.375 g     3.375 g 12.5 mL/hr over 240 Minutes Intravenous Every 8 hours 06/30/17 0218     06/29/17 2300   cefTRIAXone (ROCEPHIN) 1 g in dextrose 5 % 50 mL IVPB     1 g 100 mL/hr over 30 Minutes Intravenous  Once 06/29/17 2253 06/29/17 2340       Objective   Vitals:   06/30/17 0915 06/30/17 1000 06/30/17 1003 06/30/17 1100  BP:  (!) 151/66 (!) 151/66 (!) 173/94  Pulse: 63 65 65 68  Resp: 18 (!) 21 19 (!) 23  Temp:      TempSrc:      SpO2: 98% 94% 92% 97%  Weight:      Height:       No intake or output data in the 24 hours ending 06/30/17 1308 Filed Weights   06/30/17 0214  Weight: 72.6 kg (160 lb)     Physical Examination:  Physical Exam: Eyes: No icterus, extraocular muscles intact  Mouth: Oral mucosa is moist, no lesions on palate,  Neck: Supple, no deformities, masses, or tenderness Lungs: Normal respiratory effort, bilateral clear to auscultation, no crackles or wheezes.  Heart: Regular rate and rhythm, S1 and S2 normal, no murmurs, rubs auscultated Abdomen: BS normoactive,soft,nondistended,non-tender to palpation,no organomegaly Extremities: No pretibial edema, no erythema, no cyanosis, no clubbing Neuro : Alert and oriented to time, place and person, No focal deficits      Data Reviewed: I have personally reviewed following labs and imaging studies  CBG: Recent Labs  Lab 06/30/17 1003  GLUCAP 80    CBC: Recent Labs  Lab 06/29/17 1811 06/29/17 1845  WBC 10.9*  --   NEUTROABS 9.0*  --   HGB 13.8 15.0  HCT 42.8 44.0  MCV 84.4  --  PLT 657*  --     Basic Metabolic Panel: Recent Labs  Lab 06/29/17 1811 06/29/17 1845  NA 134* 136  K 4.5 4.4  CL 102 103  CO2 24  --   GLUCOSE 114* 115*  BUN 22* 21*  CREATININE 0.89 0.90  CALCIUM 11.1*  --     No results found for this or any previous visit (from the past 240 hour(s)).   Liver Function Tests: Recent Labs  Lab 06/29/17 1811  AST 16  ALT 10*  ALKPHOS 61  BILITOT 0.8  PROT 7.8  ALBUMIN 3.9   Recent Labs  Lab 06/29/17 1811  LIPASE 26   No results for input(s): AMMONIA in the last  168 hours.  Cardiac Enzymes: Recent Labs  Lab 06/30/17 0018 06/30/17 0603  TROPONINI <0.03 <0.03   BNP (last 3 results) Recent Labs    06/30/17 0017  BNP 152.4*    ProBNP (last 3 results) No results for input(s): PROBNP in the last 8760 hours.    Studies: Ct Abdomen Pelvis W Contrast  Result Date: 06/29/2017 CLINICAL DATA:  83 year old female with abdominal and pelvic distention. EXAM: CT ABDOMEN AND PELVIS WITH CONTRAST TECHNIQUE: Multidetector CT imaging of the abdomen and pelvis was performed using the standard protocol following bolus administration of intravenous contrast. CONTRAST:  100 cc intravenous Isovue-300 COMPARISON:  None. FINDINGS: Lower chest: No acute abnormality. Mild right lower lobe cylindrical bronchiectasis and scarring identified. Hepatobiliary: The liver is unremarkable except for hepatic cysts. Gallbladder sludge versus cholelithiasis noted without CT evidence of acute cholecystitis. There is no evidence of biliary dilatation. Pancreas: Unremarkable Spleen: Unremarkable Adrenals/Urinary Tract: Mild circumferential bladder wall thickening is nonspecific but may represent cystitis. A punctate nonobstructing right upper pole renal calculus noted. No obstructing urinary calculi are identified. The adrenal glands are unremarkable. Stomach/Bowel: A moderate hiatal hernia is noted. There is no evidence of bowel obstruction. Colonic diverticulosis noted without evidence of diverticulitis. A moderate to large amount of stool within the distal sigmoid colon noted. There may be circumferential wall thickening of the rectum present. Vascular/Lymphatic: Aortic atherosclerosis. No enlarged abdominal or pelvic lymph nodes. Reproductive: Uterus and bilateral adnexa are unremarkable. Other: No ascites, abscess or pneumoperitoneum. Musculoskeletal: No acute bony abnormalities. No suspicious bony lesions noted. IMPRESSION: 1. Question circumferential rectal wall thickening which may  represent proctitis or possibly rectal mass. Consider direct inspection/physical examination. No evidence of bowel obstruction. Moderate to large amount of distal sigmoid stool. 2. Gallbladder sludge versus cholelithiasis. No CT evidence of acute cholecystitis. 3. Mild circumferential bladder wall thickening of uncertain chronicity. Correlate with UTI/cystitis. 4. Moderate hiatal hernia 5. Punctate nonobstructing right renal calculus 6. Right lower lobe bronchiectasis and scarring 7.  Aortic Atherosclerosis (ICD10-I70.0). Electronically Signed   By: Harmon PierJeffrey  Hu M.D.   On: 06/29/2017 23:33    Scheduled Meds: . enoxaparin (LOVENOX) injection  40 mg Subcutaneous Daily      Time spent: 25 minutes  Meredeth IdeGagan S Abdirahim Flavell   [facility] Pager [phone]. If 7PM-7AM, please contact night-coverage at [email or web address], Office  [phone]  password [email or web address]  06/30/2017, 1:08 PM  LOS: 0 days

## 2017-06-30 NOTE — ED Notes (Signed)
RN notified of abnormal lab 

## 2017-06-30 NOTE — ED Notes (Signed)
Writer was unable to collect blood work.

## 2017-06-30 NOTE — ED Notes (Signed)
PT ATTEMPTED TO EAT SMALL BITE OF BREAKFAST. BECAME NAUSEATED AND EMESIS OF CLEAR MUCOUS. SEVERAL EPISODES SMALL AMOUNTS. C/O OF LOW BACK PAIN AND NAUSEA. PLAN OF CARE NAUSEA AND PAIN MEDS,

## 2017-06-30 NOTE — ED Notes (Signed)
DNR PLACED ON ARMBAND

## 2017-06-30 NOTE — H&P (View-Only) (Signed)
Referring Provider: Dr. Sharl MaLama Primary Care Physician:  Angela Coxasanayaka, Gayani Y, MD Primary Gastroenterologist:  Gentry FitzUnassigned  Reason for Consultation:  Abdominal pain; Abnormal CT; N/V  HPI: Julie Francis is a 83 y.o. female with 2 days of recurrent regurgitation during PO intake with N/V and abdominal pain. Abdominal pain is lower and constant. Denies heartburn. Denies rectal bleeding or melena. She thinks she has been having a BM once a day. Son at bedside. Has not had a colonoscopy in the past. CT showed circumferential rectal wall thickening without bowel obstruction. Large amount of stool proximal to the rectum in the sigmoid colon. On IV Abx for UTI.  Past Medical History:  Diagnosis Date  . Arthritis   . Brain aneurysm 1999   no medical follow up needed  . COPD (chronic obstructive pulmonary disease) (HCC)   . High cholesterol   . Hypertension   . Overactive bladder   . Thyroid disease     Past Surgical History:  Procedure Laterality Date  . APPENDECTOMY    . TONSILLECTOMY    . TUBAL LIGATION      Prior to Admission medications   Medication Sig Start Date End Date Taking? Authorizing Provider  acetaminophen (TYLENOL) 500 MG tablet Take 500 mg by mouth 3 (three) times daily.   Yes [provider]  albuterol (PROVENTIL) (2.5 MG/3ML) 0.083% nebulizer solution Take 2.5 mg by nebulization 3 (three) times daily as needed for wheezing or shortness of breath.    Yes [provider]  aspirin EC 81 MG tablet Take 81 mg by mouth at bedtime.    Yes [provider]  buPROPion (WELLBUTRIN XL) 150 MG 24 hr tablet Take 150 mg by mouth every morning.   Yes [provider]  Cholecalciferol (VITAMIN D) 2000 units CAPS Take 2,000 Units by mouth daily.   Yes [provider]  Cranberry-Vitamin C (AZO CRANBERRY URINARY TRACT) 250-60 MG CAPS Take 1 capsule by mouth every morning.   Yes [provider]  dorzolamide-timolol (COSOPT) 22.3-6.8 MG/ML  ophthalmic solution Place 1 drop into both eyes 2 (two) times daily. 0900 and 1700   Yes [provider]  escitalopram (LEXAPRO) 10 MG tablet Take 10 mg by mouth daily.   Yes [provider]  guaiFENesin (MUCINEX) 600 MG 12 hr tablet Take 600 mg by mouth every 12 (twelve) hours as needed for cough or to loosen phlegm.    Yes [provider]  hydrALAZINE (APRESOLINE) 10 MG tablet Take 10 mg by mouth 3 (three) times daily. 0900 1500 and 2100   Yes [provider]  ipratropium-albuterol (DUONEB) 0.5-2.5 (3) MG/3ML SOLN Take 3 mLs by nebulization every 4 (four) hours as needed (breathing).   Yes [provider]  latanoprost (XALATAN) 0.005 % ophthalmic solution Place 1 drop into both eyes at bedtime.   Yes [provider]  levothyroxine (SYNTHROID, LEVOTHROID) 50 MCG tablet Take 50 mcg by mouth daily before breakfast.   Yes [provider]  loperamide (ANTI-DIARRHEAL) 2 MG tablet Take 2 mg by mouth 4 (four) times daily as needed for diarrhea or loose stools.   Yes [provider]  montelukast (SINGULAIR) 10 MG tablet Take 10 mg by mouth at bedtime.   Yes [provider]  omeprazole (PRILOSEC) 20 MG capsule Take 20 mg by mouth daily after breakfast.    Yes [provider]  oxybutynin (DITROPAN-XL) 5 MG 24 hr tablet Take 5 mg by mouth at bedtime.   Yes [provider]  tiotropium (SPIRIVA) 18 MCG inhalation capsule Place 18 mcg into inhaler and inhale daily.   Yes [provider]  trolamine salicylate (ASPERCREME) 10 % cream Apply 1 application topically at bedtime. Apply topically to legs   Yes [provider]  clotrimazole (LOTRIMIN) 1 % cream Apply to affected area 2 times daily Patient not taking: Reported on 06/30/2017 03/21/14   Terri Piedra, PA-C    Scheduled Meds: . enoxaparin (LOVENOX) injection  40 mg Subcutaneous Daily   Continuous Infusions: . piperacillin-tazobactam  (ZOSYN)  IV Stopped (06/30/17 0800)   PRN Meds:.acetaminophen **OR** acetaminophen, albuterol, hydrALAZINE, morphine injection, ondansetron **OR** ondansetron (ZOFRAN) IV, zolpidem  Allergies as of 06/29/2017  . (No Known Allergies)    Family History  Problem Relation Age of Onset  . Cirrhosis Father     Social History   Socioeconomic History  . Marital status: Legally Separated    Spouse name: Not on file  . Number of children: Not on file  . Years of education: Not on file  . Highest education level: Not on file  Social Needs  . Financial resource strain: Not on file  . Food insecurity - worry: Not on file  . Food insecurity - inability: Not on file  . Transportation needs - medical: Not on file  . Transportation needs - non-medical: Not on file  Occupational History  . Not on file  Tobacco Use  . Smoking status: Former Smoker    Last attempt to quit: 09/11/1994    Years since quitting: 22.8  . Smokeless tobacco: Never Used  Substance and Sexual Activity  . Alcohol use: No  . Drug use: No  . Sexual activity: Not on file  Other Topics Concern  . Not on file  Social History Narrative  . Not on file    Review of Systems: All negative except as stated above in HPI.  Physical Exam: Vital signs: Vitals:   06/30/17 1003 06/30/17 1100  BP: (!) 151/66 (!) 173/94  Pulse: 65 68  Resp: 19 (!) 23  Temp:    SpO2: 92% 97%  T 98.1   General:   Lethargic, Elderly, frail, Well-developed, well-nourished, mild acute distress Head: normocephalic, atraumatic Eyes: anicteric sclera ENT: oropharynx clear, poor dentition Neck: supple, nontender Lungs:  Coarse breath sounds  Heart:  Regular rate and rhythm; no murmurs, clicks, rubs,  or gallops. Abdomen: diffuse tenderness with guarding, mild distention, +BS  Rectal:  Deferred Ext: LE tender bilaterally, no edema  GI:  Lab Results: Recent Labs    06/29/17 1811 06/29/17 1845  WBC 10.9*  --   HGB 13.8 15.0  HCT 42.8  44.0  PLT 657*  --    BMET Recent Labs    06/29/17 1811 06/29/17 1845  NA 134* 136  K 4.5 4.4  CL 102 103  CO2 24  --   GLUCOSE 114* 115*  BUN 22* 21*  CREATININE 0.89 0.90  CALCIUM 11.1*  --    LFT Recent Labs    06/29/17 1811  PROT 7.8  ALBUMIN 3.9  AST 16  ALT 10*  ALKPHOS 61  BILITOT 0.8   PT/INR No results for input(s): LABPROT, INR in the last 72 hours.   Studies/Results: Ct Abdomen Pelvis W Contrast  Result Date: 06/29/2017 CLINICAL DATA:  83 year old female with abdominal and pelvic distention. EXAM: CT ABDOMEN AND PELVIS WITH CONTRAST TECHNIQUE: Multidetector CT imaging of the abdomen and pelvis was performed using the standard protocol following bolus administration of intravenous contrast.  CONTRAST:  100 cc intravenous Isovue-300 COMPARISON:  None. FINDINGS: Lower chest: No acute abnormality. Mild right lower lobe cylindrical bronchiectasis and scarring identified. Hepatobiliary: The liver is unremarkable except for hepatic cysts. Gallbladder sludge versus cholelithiasis noted without CT evidence of acute cholecystitis. There is no evidence of biliary dilatation. Pancreas: Unremarkable Spleen: Unremarkable Adrenals/Urinary Tract: Mild circumferential bladder wall thickening is nonspecific but may represent cystitis. A punctate nonobstructing right upper pole renal calculus noted. No obstructing urinary calculi are identified. The adrenal glands are unremarkable. Stomach/Bowel: A moderate hiatal hernia is noted. There is no evidence of bowel obstruction. Colonic diverticulosis noted without evidence of diverticulitis. A moderate to large amount of stool within the distal sigmoid colon noted. There may be circumferential wall thickening of the rectum present. Vascular/Lymphatic: Aortic atherosclerosis. No enlarged abdominal or pelvic lymph nodes. Reproductive: Uterus and bilateral adnexa are unremarkable. Other: No ascites, abscess or pneumoperitoneum. Musculoskeletal: No  acute bony abnormalities. No suspicious bony lesions noted. IMPRESSION: 1. Question circumferential rectal wall thickening which may represent proctitis or possibly rectal mass. Consider direct inspection/physical examination. No evidence of bowel obstruction. Moderate to large amount of distal sigmoid stool. 2. Gallbladder sludge versus cholelithiasis. No CT evidence of acute cholecystitis. 3. Mild circumferential bladder wall thickening of uncertain chronicity. Correlate with UTI/cystitis. 4. Moderate hiatal hernia 5. Punctate nonobstructing right renal calculus 6. Right lower lobe bronchiectasis and scarring 7.  Aortic Atherosclerosis (ICD10-I70.0). Electronically Signed   By: Harmon PierJeffrey  Hu M.D.   On: 06/29/2017 23:33    Impression/Plan: 83 yo with lower abdominal pain and N/V with CT findings concerning for a rectal mass in need of a flex sig. Will also do an EGD due to the recurrent regurgitation/N/V. Clear liquid diet. NPO p MN. Risks/benefits of EGD/flex sig discussed with patient and her son and they agree to proceed.    LOS: 0 days   Kashae Carstens C.  06/30/2017, 11:50 AM  Pager [phone]  AFTER 5 pm or on weekends please call [phone]

## 2017-06-30 NOTE — ED Notes (Signed)
ED TO INPATIENT HANDOFF REPORT  Name/Age/Gender Christ Kick 83 y.o. female  Code Status    Code Status Orders  (From admission, onward)        Start     Ordered   06/30/17 0019  Do not attempt resuscitation (DNR)  Continuous    Question Answer Comment  In the event of cardiac or respiratory ARREST Do not call a "code blue"   In the event of cardiac or respiratory ARREST Do not perform Intubation, CPR, defibrillation or ACLS   In the event of cardiac or respiratory ARREST Use medication by any route, position, wound care, and other measures to relive pain and suffering. May use oxygen, suction and manual treatment of airway obstruction as needed for comfort.      06/30/17 0019    Code Status History    Date Active Date Inactive Code Status Order ID Comments User Context   09/11/2012 21:21 09/16/2012 20:43 Full Code [id number]  Merton Border, MD Inpatient      Home/SNF/Other Skilled nursing facility  Chief Complaint vomiting, nausea  Level of Care/Admitting Diagnosis ED Disposition    ED Disposition Condition [place]: [facility] [[id number]]  Level of Care: Telemetry [5]  Admit to tele based on following criteria: Other see comments  Comments: hx of CHF  Diagnosis: UTI (urinary tract infection) [170017]  Admitting Physician: Ivor Costa [[id number]]  Attending Physician: Ivor Costa [phone]  Estimated length of stay: past midnight tomorrow  Certification:: I certify this patient will need inpatient services for at least 2 midnights  PT Class (Do Not Modify): Inpatient [101]  PT Acc Code (Do Not Modify): Private [1]       Medical History Past Medical History:  Diagnosis Date  . Arthritis   . Brain aneurysm 1999   no medical follow up needed  . COPD (chronic obstructive pulmonary disease) ([place])   . High cholesterol   . Hypertension   . Overactive bladder   . Thyroid disease     Allergies No Known Allergies  IV  Location/Drains/Wounds Patient Lines/Drains/Airways Status   Active Line/Drains/Airways    Name:   Placement date:   Placement time:   Site:   Days:   Peripheral IV 06/29/17 Left Forearm   06/29/17    1839    Forearm   1          Labs/Imaging Results for orders placed or performed during the hospital encounter of 06/29/17 (from the past 48 hour(s))  CBC with Differential     Status: Abnormal   Collection Time: 06/29/17  6:11 PM  Result Value Ref Range   WBC 10.9 (H) 4.0 - 10.5 K/uL   RBC 5.07 3.87 - 5.11 MIL/uL   Hemoglobin 13.8 12.0 - 15.0 g/dL   HCT 42.8 36.0 - 46.0 %   MCV 84.4 78.0 - 100.0 fL   MCH 27.2 26.0 - 34.0 pg   MCHC 32.2 30.0 - 36.0 g/dL   RDW 14.8 11.5 - 15.5 %   Platelets 657 (H) 150 - 400 K/uL   Neutrophils Relative % 82 %   Neutro Abs 9.0 (H) 1.7 - 7.7 K/uL   Lymphocytes Relative 10 %   Lymphs Abs 1.1 0.7 - 4.0 K/uL   Monocytes Relative 6 %   Monocytes Absolute 0.7 0.1 - 1.0 K/uL   Eosinophils Relative 1 %   Eosinophils Absolute 0.1 0.0 - 0.7 K/uL   Basophils Relative 1 %   Basophils  Absolute 0.1 0.0 - 0.1 K/uL  Comprehensive metabolic panel     Status: Abnormal   Collection Time: 06/29/17  6:11 PM  Result Value Ref Range   Sodium 134 (L) 135 - 145 mmol/L   Potassium 4.5 3.5 - 5.1 mmol/L   Chloride 102 101 - 111 mmol/L   CO2 24 22 - 32 mmol/L   Glucose, Bld 114 (H) 65 - 99 mg/dL   BUN 22 (H) 6 - 20 mg/dL   Creatinine, Ser 0.89 0.44 - 1.00 mg/dL   Calcium 11.1 (H) 8.9 - 10.3 mg/dL   Total Protein 7.8 6.5 - 8.1 g/dL   Albumin 3.9 3.5 - 5.0 g/dL   AST 16 15 - 41 U/L   ALT 10 (L) 14 - 54 U/L   Alkaline Phosphatase 61 38 - 126 U/L   Total Bilirubin 0.8 0.3 - 1.2 mg/dL   GFR calc non Af Amer 55 (L) >60 mL/min   GFR calc Af Amer >60 >60 mL/min    Comment: (NOTE) The eGFR has been calculated using the CKD EPI equation. This calculation has not been validated in all clinical situations. eGFR's persistently <60 mL/min signify possible Chronic  Kidney Disease.    Anion gap 8 5 - 15  Lipase, blood     Status: None   Collection Time: 06/29/17  6:11 PM  Result Value Ref Range   Lipase 26 11 - 51 U/L  Urinalysis, Routine w reflex microscopic     Status: Abnormal   Collection Time: 06/29/17  6:11 PM  Result Value Ref Range   Color, Urine YELLOW YELLOW   APPearance HAZY (A) CLEAR   Specific Gravity, Urine 1.017 1.005 - 1.030   pH 5.0 5.0 - 8.0   Glucose, UA NEGATIVE NEGATIVE mg/dL   Hgb urine dipstick NEGATIVE NEGATIVE   Bilirubin Urine NEGATIVE NEGATIVE   Ketones, ur 5 (A) NEGATIVE mg/dL   Protein, ur 30 (A) NEGATIVE mg/dL   Nitrite POSITIVE (A) NEGATIVE   Leukocytes, UA NEGATIVE NEGATIVE   RBC / HPF 0-5 0 - 5 RBC/hpf   WBC, UA 6-30 0 - 5 WBC/hpf   Bacteria, UA RARE (A) NONE SEEN   Squamous Epithelial / LPF 0-5 (A) NONE SEEN   Mucus PRESENT   I-stat Chem 8, ED     Status: Abnormal   Collection Time: 06/29/17  6:45 PM  Result Value Ref Range   Sodium 136 135 - 145 mmol/L   Potassium 4.4 3.5 - 5.1 mmol/L   Chloride 103 101 - 111 mmol/L   BUN 21 (H) 6 - 20 mg/dL   Creatinine, Ser 0.90 0.44 - 1.00 mg/dL   Glucose, Bld 115 (H) 65 - 99 mg/dL   Calcium, Ion 1.42 (H) 1.15 - 1.40 mmol/L   TCO2 24 22 - 32 mmol/L   Hemoglobin 15.0 12.0 - 15.0 g/dL   HCT 44.0 36.0 - 46.0 %  Brain natriuretic peptide     Status: Abnormal   Collection Time: 06/30/17 12:17 AM  Result Value Ref Range   B Natriuretic Peptide 152.4 (H) 0.0 - 100.0 pg/mL  Troponin I (q 6hr x 3)     Status: None   Collection Time: 06/30/17 12:18 AM  Result Value Ref Range   Troponin I <0.03 <0.03 ng/mL  Troponin I (q 6hr x 3)     Status: None   Collection Time: 06/30/17  6:03 AM  Result Value Ref Range   Troponin I <0.03 <0.03 ng/mL  CBG monitoring, ED  Status: None   Collection Time: 06/30/17 10:03 AM  Result Value Ref Range   Glucose-Capillary 80 65 - 99 mg/dL  Troponin I (q 6hr x 3)     Status: Abnormal   Collection Time: 06/30/17  1:20 PM  Result  Value Ref Range   Troponin I 0.06 (HH) <0.03 ng/mL    Comment: CRITICAL RESULT CALLED TO, READ BACK BY AND VERIFIED WITH: S.WEST RN AT 1420 ON 06/30/17 BY S.VANHOORNE    Ct Abdomen Pelvis W Contrast  Result Date: 06/29/2017 CLINICAL DATA:  83 year old female with abdominal and pelvic distention. EXAM: CT ABDOMEN AND PELVIS WITH CONTRAST TECHNIQUE: Multidetector CT imaging of the abdomen and pelvis was performed using the standard protocol following bolus administration of intravenous contrast. CONTRAST:  100 cc intravenous Isovue-300 COMPARISON:  None. FINDINGS: Lower chest: No acute abnormality. Mild right lower lobe cylindrical bronchiectasis and scarring identified. Hepatobiliary: The liver is unremarkable except for hepatic cysts. Gallbladder sludge versus cholelithiasis noted without CT evidence of acute cholecystitis. There is no evidence of biliary dilatation. Pancreas: Unremarkable Spleen: Unremarkable Adrenals/Urinary Tract: Mild circumferential bladder wall thickening is nonspecific but may represent cystitis. A punctate nonobstructing right upper pole renal calculus noted. No obstructing urinary calculi are identified. The adrenal glands are unremarkable. Stomach/Bowel: A moderate hiatal hernia is noted. There is no evidence of bowel obstruction. Colonic diverticulosis noted without evidence of diverticulitis. A moderate to large amount of stool within the distal sigmoid colon noted. There may be circumferential wall thickening of the rectum present. Vascular/Lymphatic: Aortic atherosclerosis. No enlarged abdominal or pelvic lymph nodes. Reproductive: Uterus and bilateral adnexa are unremarkable. Other: No ascites, abscess or pneumoperitoneum. Musculoskeletal: No acute bony abnormalities. No suspicious bony lesions noted. IMPRESSION: 1. Question circumferential rectal wall thickening which may represent proctitis or possibly rectal mass. Consider direct inspection/physical examination. No evidence  of bowel obstruction. Moderate to large amount of distal sigmoid stool. 2. Gallbladder sludge versus cholelithiasis. No CT evidence of acute cholecystitis. 3. Mild circumferential bladder wall thickening of uncertain chronicity. Correlate with UTI/cystitis. 4. Moderate hiatal hernia 5. Punctate nonobstructing right renal calculus 6. Right lower lobe bronchiectasis and scarring 7.  Aortic Atherosclerosis (ICD10-I70.0). Electronically Signed   By: Margarette Canada M.D.   On: 06/29/2017 23:33    Pending Labs Unresulted Labs (From admission, onward)   Start     Ordered   07/01/17 0500  CBC  Tomorrow morning,   R     06/30/17 1319   07/01/17 0500  Comprehensive metabolic panel  Tomorrow morning,   R     06/30/17 1319   06/30/17 0018  Culture, blood (Routine X 2) w Reflex to ID Panel  BLOOD CULTURE X 2,   R    Comments:  Please obtain prior to antibiotic administration.    06/30/17 0017   06/30/17 0018  Urine Culture  Once,   R    Comments:  Please get clean catch specimen ONLY (DO NOT obtain from urinal)    06/30/17 0017   06/30/17 0017  Parathyroid hormone, intact (no Ca)  Once,   R     06/30/17 0017      Vitals/Pain Today's Vitals   06/30/17 1630 06/30/17 1730 06/30/17 1730 06/30/17 1800  BP: (!) 170/97   (!) 176/79  Pulse: 88   85  Resp: (!) 22   18  Temp:  99.7 F (37.6 C) 98.7 F (37.1 C)   TempSrc:  Axillary Oral   SpO2: 92%   95%  Weight:  Height:      PainSc:        Isolation Precautions No active isolations  Medications Medications  enoxaparin (LOVENOX) injection 40 mg (40 mg Subcutaneous Not Given 06/30/17 1000)  acetaminophen (TYLENOL) tablet 650 mg (not administered)    Or  acetaminophen (TYLENOL) suppository 650 mg (not administered)  ondansetron (ZOFRAN) tablet 4 mg ( Oral See Alternative 06/30/17 1822)    Or  ondansetron (ZOFRAN) injection 4 mg (4 mg Intravenous Given 06/30/17 1822)  hydrALAZINE (APRESOLINE) injection 5 mg (5 mg Intravenous Given 06/30/17 1815)   zolpidem (AMBIEN) tablet 5 mg (not administered)  morphine 4 MG/ML injection 1 mg (1 mg Intravenous Given 06/30/17 1129)  piperacillin-tazobactam (ZOSYN) IVPB 3.375 g (0 g Intravenous Stopped 06/30/17 1651)  0.9 %  sodium chloride infusion ( Intravenous New Bag/Given 06/30/17 1656)  acetaminophen (TYLENOL) tablet 500 mg (500 mg Oral Not Given 06/30/17 1658)  aspirin EC tablet 81 mg (not administered)  buPROPion (WELLBUTRIN XL) 24 hr tablet 150 mg (not administered)  dorzolamide-timolol (COSOPT) 22.3-6.8 MG/ML ophthalmic solution 1 drop (not administered)  escitalopram (LEXAPRO) tablet 10 mg (not administered)  ipratropium-albuterol (DUONEB) 0.5-2.5 (3) MG/3ML nebulizer solution 3 mL (not administered)  latanoprost (XALATAN) 0.005 % ophthalmic solution 1 drop (not administered)  levothyroxine (SYNTHROID, LEVOTHROID) tablet 50 mcg (not administered)  montelukast (SINGULAIR) tablet 10 mg (not administered)  pantoprazole (PROTONIX) EC tablet 40 mg (40 mg Oral Given 06/30/17 1712)  MUSCLE RUB CREA (not administered)  metoprolol tartrate (LOPRESSOR) injection 2.5 mg (not administered)  ondansetron (ZOFRAN) injection 4 mg (4 mg Intravenous Given 06/29/17 1839)  0.9 %  sodium chloride infusion (0 mLs Intravenous Stopped 06/29/17 2230)  fentaNYL (SUBLIMAZE) injection 50 mcg (50 mcg Intravenous Given 06/29/17 1839)  iopamidol (ISOVUE-300) 61 % injection (100 mLs Intravenous Contrast Given 06/29/17 2232)  cefTRIAXone (ROCEPHIN) 1 g in dextrose 5 % 50 mL IVPB (0 g Intravenous Stopped 06/29/17 2340)  0.9 %  sodium chloride infusion (1,000 mLs Intravenous Rate/Dose Verify 06/30/17 1300)    Mobility Wheelchair

## 2017-06-30 NOTE — ED Notes (Signed)
Date and time results received: 06/30/17 1433  (use smartphrase ".now" to insert current timE  Test: troponin 0.06 Critical Value: 0.06  Name of Provider Notified: ADMISSION  Orders Received? Or Actions Taken?: WILL WRITE ORDERS

## 2017-07-01 ENCOUNTER — Encounter (HOSPITAL_COMMUNITY): Payer: Self-pay | Admitting: Certified Registered Nurse Anesthetist

## 2017-07-01 ENCOUNTER — Inpatient Hospital Stay (HOSPITAL_COMMUNITY): Payer: Medicare Other | Admitting: Anesthesiology

## 2017-07-01 ENCOUNTER — Encounter (HOSPITAL_COMMUNITY)
Admission: EM | Disposition: A | Discharge status: Skilled Nursing Facility | Payer: Self-pay | Source: Home / Self Care | Attending: Family Medicine

## 2017-07-01 DIAGNOSIS — R112 Nausea with vomiting, unspecified: Secondary | ICD-10-CM | POA: Diagnosis present

## 2017-07-01 HISTORY — PX: ESOPHAGOGASTRODUODENOSCOPY (EGD) WITH PROPOFOL: SHX5813

## 2017-07-01 HISTORY — PX: FLEXIBLE SIGMOIDOSCOPY: SHX5431

## 2017-07-01 LAB — COMPREHENSIVE METABOLIC PANEL
ALK PHOS: 48 U/L (ref 38–126)
ALT: 10 U/L — AB (ref 14–54)
AST: 14 U/L — AB (ref 15–41)
Albumin: 3.3 g/dL — ABNORMAL LOW (ref 3.5–5.0)
Anion gap: 5 (ref 5–15)
BILIRUBIN TOTAL: 0.9 mg/dL (ref 0.3–1.2)
BUN: 17 mg/dL (ref 6–20)
CALCIUM: 10.4 mg/dL — AB (ref 8.9–10.3)
CO2: 26 mmol/L (ref 22–32)
CREATININE: 0.89 mg/dL (ref 0.44–1.00)
Chloride: 106 mmol/L (ref 101–111)
GFR calc Af Amer: >60 mL/min (ref >60)
GFR, EST NON AFRICAN AMERICAN: 55 mL/min — AB (ref >60)
GLUCOSE: 86 mg/dL (ref 65–99)
POTASSIUM: 4.2 mmol/L (ref 3.5–5.1)
Sodium: 137 mmol/L (ref 135–145)
TOTAL PROTEIN: 6.4 g/dL — AB (ref 6.5–8.1)

## 2017-07-01 LAB — URINE CULTURE

## 2017-07-01 LAB — CBC
HEMATOCRIT: 40.7 % (ref 36.0–46.0)
HEMOGLOBIN: 12.4 g/dL (ref 12.0–15.0)
MCH: 26.4 pg (ref 26.0–34.0)
MCHC: 30.5 g/dL (ref 30.0–36.0)
MCV: 86.8 fL (ref 78.0–100.0)
Platelets: 598 10*3/uL — ABNORMAL HIGH (ref 150–400)
RBC: 4.69 MIL/uL (ref 3.87–5.11)
RDW: 15.3 % (ref 11.5–15.5)
WBC: 13.1 10*3/uL — AB (ref 4.0–10.5)

## 2017-07-01 LAB — GLUCOSE, CAPILLARY: Glucose-Capillary: 78 mg/dL (ref 65–99)

## 2017-07-01 LAB — MRSA PCR SCREENING: MRSA BY PCR: NEGATIVE

## 2017-07-01 LAB — PARATHYROID HORMONE, INTACT (NO CA): PTH: 95 pg/mL — ABNORMAL HIGH (ref 15–65)

## 2017-07-01 SURGERY — ESOPHAGOGASTRODUODENOSCOPY (EGD) WITH PROPOFOL
Anesthesia: Monitor Anesthesia Care

## 2017-07-01 MED ORDER — PANTOPRAZOLE SODIUM 40 MG IV SOLR
40.0000 mg | Freq: Two times a day (BID) | INTRAVENOUS | Status: DC
  Administered 2017-07-01: 40 mg via INTRAVENOUS
  Filled 2017-07-01: qty 40

## 2017-07-01 MED ORDER — PROPOFOL 10 MG/ML IV BOLUS
INTRAVENOUS | Status: DC | PRN
  Administered 2017-07-01: 30 mg via INTRAVENOUS

## 2017-07-01 MED ORDER — FLUCONAZOLE IN SODIUM CHLORIDE 200-0.9 MG/100ML-% IV SOLN
200.0000 mg | INTRAVENOUS | Status: DC
  Administered 2017-07-01 – 2017-07-02 (×2): 200 mg via INTRAVENOUS
  Filled 2017-07-01 (×3): qty 100

## 2017-07-01 MED ORDER — ONDANSETRON HCL 4 MG/2ML IJ SOLN
INTRAMUSCULAR | Status: DC | PRN
  Administered 2017-07-01: 4 mg via INTRAVENOUS

## 2017-07-01 MED ORDER — PROPOFOL 10 MG/ML IV BOLUS
INTRAVENOUS | Status: AC
  Filled 2017-07-01: qty 40

## 2017-07-01 MED ORDER — PROPOFOL 500 MG/50ML IV EMUL
INTRAVENOUS | Status: DC | PRN
  Administered 2017-07-01: 75 ug/kg/min via INTRAVENOUS

## 2017-07-01 MED ORDER — LIDOCAINE 2% (20 MG/ML) 5 ML SYRINGE
INTRAMUSCULAR | Status: DC | PRN
  Administered 2017-07-01: 100 mg via INTRAVENOUS

## 2017-07-01 SURGICAL SUPPLY — 14 items

## 2017-07-01 NOTE — Op Note (Signed)
[facility] Patient Name: Julie Francis Procedure Date: 07/01/2017 MRN: [id number] Attending MD: Shirley Friar , MD Date of Birth: 06-Jan-1923 CSN: [id number] Age: 83 Admit Type: Inpatient Procedure:                Flexible Sigmoidoscopy Indications:              Generalized abdominal pain, Abnormal CT of the GI                            tract Providers:                Shirley Friar, MD, Jacquiline Doe, RN, Zoila Shutter, Technician, Stephanie [place], CRNA Referring MD:              Medicines:                Propofol per Anesthesia, Monitored Anesthesia Care Complications:            No immediate complications. Estimated Blood Loss:     Estimated blood loss was minimal. Procedure:                Pre-Anesthesia Assessment:                           - Prior to the procedure, a History and Physical                            was performed, and patient medications and                            allergies were reviewed. The patient's tolerance of                            previous anesthesia was also reviewed. The risks                            and benefits of the procedure and the sedation                            options and risks were discussed with the patient.                            All questions were answered, and informed consent                            was obtained. Prior Anticoagulants: The patient has                            taken no previous anticoagulant or antiplatelet                            agents. ASA Grade Assessment: III - A patient with  severe systemic disease. After reviewing the risks                            and benefits, the patient was deemed in                            satisfactory condition to undergo the procedure.                           After obtaining informed consent, the scope was                            passed under direct vision. The [id number]  ([id number]                            scope was introduced through the anus and advanced                            to the the descending colon. The flexible                            sigmoidoscopy was performed with difficulty due to                            poor endoscopic visualization. The patient                            tolerated the procedure well. The quality of the                            bowel preparation was an unprepped procedure. Scope In: Scope Out: Findings:      The perianal exam findings include non-thrombosed external hemorrhoids       and internal hemorrhoids (Grade I).      A diffuse area of moderately congested, plaque covered and       vascular-pattern-decreased mucosa was found in the recto-sigmoid colon       and in the sigmoid colon. Biopsies were taken with a cold forceps for       histology. Estimated blood loss was minimal.      Multiple small-mouthed diverticula were found in the sigmoid colon and       distal descending colon.      A segmental area of moderately congested and ulcerated mucosa was found       in the distal rectum. Biopsies were taken with a cold forceps for       histology. Estimated blood loss was minimal.      Retroflexion done but due to solid stool unable to see distal rectum in       retroflexed view. Impression:               - Non-thrombosed external hemorrhoids and internal                            hemorrhoids (Grade I) found on perianal exam.                           -  Congested, plaque covered and                            vascular-pattern-decreased mucosa in the                            recto-sigmoid colon and in the sigmoid colon.                            Biopsied.                           - Diverticulosis in the sigmoid colon and in the                            distal descending colon.                           - Congested and ulcerated mucosa in the distal                            rectum.  Biopsied. Moderate Sedation:      N/A- Per Anesthesia Care Recommendation:           - Await pathology results. Procedure Code(s):        --- Professional ---                           [phone], Sigmoidoscopy, flexible; with biopsy, single                            or multiple Diagnosis Code(s):        --- Professional ---                           R10.84, Generalized abdominal pain                           K62.6, Ulcer of anus and rectum                           K62.89, Other specified diseases of anus and rectum                           K63.89, Other specified diseases of intestine                           K64.0, First degree hemorrhoids                           K64.4, Residual hemorrhoidal skin tags                           R93.3, Abnormal findings on diagnostic imaging of                            other parts of digestive tract  K57.30, Diverticulosis of large intestine without                            perforation or abscess without bleeding CPT copyright 2016 [employer]. All rights reserved. The codes documented in this report are preliminary and upon coder review may  be revised to meet current compliance requirements. Shirley FriarVincent C Pascal Stiggers, MD 07/01/2017 9:18:26 AM This report has been signed electronically. Number of Addenda: 0

## 2017-07-01 NOTE — Anesthesia Postprocedure Evaluation (Signed)
Anesthesia Post Note  Patient: Julie Francis  Procedure(s) Performed: ESOPHAGOGASTRODUODENOSCOPY (EGD) WITH PROPOFOL (N/A ) FLEXIBLE SIGMOIDOSCOPY (N/A )     Patient location during evaluation: Endoscopy Anesthesia Type: MAC Level of consciousness: awake and alert Pain management: pain level controlled Vital Signs Assessment: post-procedure vital signs reviewed and stable Respiratory status: spontaneous breathing, nonlabored ventilation and respiratory function stable Cardiovascular status: stable and blood pressure returned to baseline Postop Assessment: no apparent nausea or vomiting Anesthetic complications: no    Last Vitals:  Vitals:   07/01/17 0935 07/01/17 1008  BP:  (!) 165/62  Pulse: 65 79  Resp: 19 18  Temp:  36.5 C  SpO2: 100% 100%    Last Pain:  Vitals:   07/01/17 1015  TempSrc:   PainSc: 0-No pain                 Catalina Gravel

## 2017-07-01 NOTE — Care Management Note (Signed)
Case Management Note  Patient Details  Name: Julie Francis MRN: [id number] Date of Birth: 17-Nov-1922  Subjective/Objective: 83 y/o f admitted w/abd pain. UTI. GI following-EGD/Flex sig. From ALF-[facility]. PT cons-await recc. CSW already following.                 Action/Plan:d/c plan ALF.   Expected Discharge Date:  (unknown)               Expected Discharge Plan:  Assisted Living / Rest Home  In-House Referral:  Clinical Social Work  Discharge planning Services  CM Consult  Post Acute Care Choice:    Choice offered to:     DME Arranged:    DME Agency:     HH Arranged:    HH Agency:     Status of Service:  In process, will continue to follow  If discussed at Long Length of Stay Meetings, dates discussed:    Additional Comments:  Lanier ClamMahabir, Skiler Olden, RN 07/01/2017, 12:23 PM

## 2017-07-01 NOTE — Transfer of Care (Signed)
Immediate Anesthesia Transfer of Care Note  Patient: Julie Francis  Procedure(s) Performed: ESOPHAGOGASTRODUODENOSCOPY (EGD) WITH PROPOFOL (N/A ) FLEXIBLE SIGMOIDOSCOPY (N/A )  Patient Location: PACU and Endoscopy Unit  Anesthesia Type:MAC  Level of Consciousness: awake and alert   Airway & Oxygen Therapy: Patient Spontanous Breathing and Patient connected to nasal cannula oxygen  Post-op Assessment: Report given to RN and Post -op Vital signs reviewed and stable  Post vital signs: Reviewed and stable  Last Vitals:  Vitals:   07/01/17 0530 07/01/17 0728  BP: (!) 160/77 (!) 161/59  Pulse: 72 64  Resp: 18 16  Temp: 37.2 C 36.5 C  SpO2: 100% 100%    Last Pain:  Vitals:   07/01/17 0728  TempSrc: Oral  PainSc:          Complications: No apparent anesthesia complications

## 2017-07-01 NOTE — Anesthesia Preprocedure Evaluation (Signed)
Anesthesia Evaluation  Patient identified by MRN, date of birth, ID band Patient awake    Reviewed: Allergy & Precautions, NPO status , Patient's Chart, lab work & pertinent test results  Airway Mallampati: III  TM Distance: >3 FB Neck ROM: Full    Dental  (+) Dental Advisory Given, Missing   Pulmonary COPD,  COPD inhaler, former smoker,    Pulmonary exam normal breath sounds clear to auscultation       Cardiovascular hypertension, +CHF  Normal cardiovascular exam Rhythm:Regular Rate:Normal     Neuro/Psych PSYCHIATRIC DISORDERS Depression Aneurysm     GI/Hepatic Neg liver ROS, GERD  Medicated,  Endo/Other  Hypothyroidism   Renal/GU negative Renal ROS     Musculoskeletal  (+) Arthritis , Osteoarthritis,    Abdominal   Peds  Hematology negative hematology ROS (+) Blood dyscrasia (thrombocytosis), ,   Anesthesia Other Findings Day of surgery medications reviewed with the patient.  Reproductive/Obstetrics                             Anesthesia Physical Anesthesia Plan  ASA: III  Anesthesia Plan: MAC   Post-op Pain Management:    Induction: Intravenous  PONV Risk Score and Plan: 2 and Propofol infusion and Ondansetron  Airway Management Planned: Nasal Cannula  Additional Equipment:   Intra-op Plan:   Post-operative Plan:   Informed Consent: I have reviewed the patients History and Physical, chart, labs and discussed the procedure including the risks, benefits and alternatives for the proposed anesthesia with the patient or authorized representative who has indicated his/her understanding and acceptance.   Dental advisory given  Plan Discussed with: CRNA and Anesthesiologist  Anesthesia Plan Comments: (Discussed risks/benefits/alternatives to MAC sedation including need for ventilatory support, hypotension, need for conversion to general anesthesia.  All patient questions  answered.  Patient/guardian wishes to proceed.)        Anesthesia Quick Evaluation

## 2017-07-01 NOTE — Op Note (Signed)
[facility] Patient Name: Julie Francis Procedure Date: 07/01/2017 MRN: [id number] Attending MD: Shirley Friar , MD Date of Birth: 12/21/1922 CSN: [id number] Age: 83 Admit Type: Inpatient Procedure:                Upper GI endoscopy Indications:              Generalized abdominal pain, Nausea with vomiting Providers:                Shirley Friar, MD, Jacquiline Doe, RN, Zoila Shutter, Technician, Stephanie [place], CRNA Referring MD:              Medicines:                Propofol per Anesthesia, Monitored Anesthesia Care Complications:            No immediate complications. Estimated Blood Loss:     Estimated blood loss: none. Procedure:                Pre-Anesthesia Assessment:                           - Prior to the procedure, a History and Physical                            was performed, and patient medications and                            allergies were reviewed. The patient's tolerance of                            previous anesthesia was also reviewed. The risks                            and benefits of the procedure and the sedation                            options and risks were discussed with the patient.                            All questions were answered, and informed consent                            was obtained. Prior Anticoagulants: The patient has                            taken no previous anticoagulant or antiplatelet                            agents. ASA Grade Assessment: III - A patient with                            severe systemic disease. After reviewing the risks  and benefits, the patient was deemed in                            satisfactory condition to undergo the procedure.                           After obtaining informed consent, the endoscope was                            passed under direct vision. Throughout the                            procedure, the  patient's blood pressure, pulse, and                            oxygen saturations were monitored continuously. The                            [id number] ([id number] scope was introduced through the                            mouth, and advanced to the second part of duodenum.                            The upper GI endoscopy was performed with                            difficulty due to presence of food. Successful                            completion of the procedure was aided by performing                            the maneuvers documented (below) in this report.                            The patient tolerated the procedure well. Scope In: Scope Out: Findings:      Food was found in the entire esophagus. Removal of food was accomplished.      Diffuse candidiasis was found in the entire esophagus.      Few cratered esophageal ulcers with no bleeding and stigmata of recent       bleeding were found in the lower third of the esophagus. The largest       lesion was 8 mm in largest dimension.      One moderate benign-appearing, intrinsic stenosis was found at the       gastroesophageal junction. And was traversed.      A medium-sized hiatal hernia was present.      Segmental mild inflammation characterized by congestion (edema) and       erythema was found in the prepyloric region of the stomach.      The examined duodenum was normal.      A large amount of food (residue) was found in the gastric body. Impression:               -  Food in the esophagus. Removal was successful.                           - Monilial esophagitis.                           - Non-bleeding esophageal ulcers.                           - Benign-appearing esophageal stenosis.                           - Medium-sized hiatal hernia.                           - Acute gastritis.                           - Normal examined duodenum.                           - A large amount of food (residue) in the stomach. Moderate  Sedation:      N/A- Per Anesthesia Care Recommendation:           - Observe patient's clinical course.                           - NPO.                           - Diflucan (fluconazole) 200 mg IV daily.                           - Post procedure medication orders were given. Procedure Code(s):        --- Professional ---                           [phone], Esophagogastroduodenoscopy, flexible,                            transoral; with removal of foreign body(s) Diagnosis Code(s):        --- Professional ---                           R10.84, Generalized abdominal pain                           R11.2, Nausea with vomiting, unspecified                           T18.128A, Food in esophagus causing other injury,                            initial encounter                           B37.81, Candidal esophagitis  K22.10, Ulcer of esophagus without bleeding                           K22.2, Esophageal obstruction                           K29.00, Acute gastritis without bleeding                           T18.2XXA, Foreign body in stomach, initial encounter                           K44.9, Diaphragmatic hernia without obstruction or                            gangrene CPT copyright 2016 [employer]. All rights reserved. The codes documented in this report are preliminary and upon coder review may  be revised to meet current compliance requirements. Shirley Friar, MD 07/01/2017 9:12:22 AM This report has been signed electronically. Number of Addenda: 0

## 2017-07-01 NOTE — Progress Notes (Addendum)
[facility]  PROGRESS NOTE  Julie Francis ZOX:[id number] DOB: 1922/10/10 DOA: 06/29/2017 PCP: Angela Coxasanayaka, Gayani Y, MD   Brief HPI:  83 y.o. female with medical history significant of hypertension, COPD, GERD, hypothyroidism, CHF, bradycardia aneurysm, depression, who presents with nausea, vomiting, lower abdominal pain and increased urinary frequency      Subjective   Patient seen and examined, drowsy after patient underwent flexible sigmoidoscopy.   Assessment/Plan:     1. ? UTI-UA positive for nitrite, patient started on Zosyn.  Urine culture grew multiple species.  Will discontinue IV Zosyn. 2. Abdominal pain-likely combination of UTI and? Proctitis. Continue IV Zosyn. Zofran when necessary for nausea and vomiting. 3.  Rectal mass- patient underwent flexible sigmoidoscopy, which showed congested plaque-like mass in the rectum.  Biopsy obtained. 4. Hypercalcemia- acid was 11.1, PTH ordered. Concern for malignancy. Continue IV fluids.  Today calcium is 10.4.  Check calcium level in a.m. 5. Hypothyroidism-continue Synthroid 6. Hypertension- blood pressure stable, continue hydralazine 7. Depression-continue with Wellbutrin, Lexapro    DVT prophylaxis: Lovenox  Code Status: DO NOT RESUSCITATE  Family Communication: Discussed with patient's son at bedside   Disposition Plan: Likely skilled facility in next 2-3 days   Consultants:  Gastroenterology  Procedures:  None  Continuous infusions . fluconazole (DIFLUCAN) IV Stopped (07/01/17 1233)  . piperacillin-tazobactam (ZOSYN)  IV 3.375 g (07/01/17 1258)      Antibiotics:   Anti-infectives (From admission, onward)   Start     Dose/Rate Route Frequency Ordered Stop   07/01/17 1200  fluconazole (DIFLUCAN) IVPB 200 mg     200 mg 100 mL/hr over 60 Minutes Intravenous Every 24 hours 07/01/17 1027     06/30/17 2300  cefTRIAXone (ROCEPHIN) 1 g in dextrose 5 % 50 mL IVPB  Status:  Discontinued     1 g 100  mL/hr over 30 Minutes Intravenous  Once 06/30/17 0016 06/30/17 0208   06/30/17 0400  piperacillin-tazobactam (ZOSYN) IVPB 3.375 g     3.375 g 12.5 mL/hr over 240 Minutes Intravenous Every 8 hours 06/30/17 0218     06/29/17 2300  cefTRIAXone (ROCEPHIN) 1 g in dextrose 5 % 50 mL IVPB     1 g 100 mL/hr over 30 Minutes Intravenous  Once 06/29/17 2253 06/29/17 2340       Objective   Vitals:   07/01/17 0925 07/01/17 0930 07/01/17 0935 07/01/17 1008  BP:  (!) 164/60  (!) 165/62  Pulse: 65 66 65 79  Resp: 17 19 19 18   Temp:    97.7 F (36.5 C)  TempSrc:    Oral  SpO2: 99% 99% 100% 100%  Weight:      Height:        Intake/Output Summary (Last 24 hours) at 07/01/2017 1354 Last data filed at 07/01/2017 1133 Gross per 24 hour  Intake 1011.33 ml  Output 350 ml  Net 661.33 ml   Filed Weights   06/30/17 1934 07/01/17 0422 07/01/17 0728  Weight: 65.6 kg (144 lb 10 oz) 65.3 kg (143 lb 15.4 oz) 64.9 kg (143 lb)     Physical Examination:  Physical Exam: Eyes: No icterus, extraocular muscles intact  Mouth: Oral mucosa is moist, no lesions on palate,  Neck: Supple, no deformities, masses, or tenderness Lungs: Normal respiratory effort, bilateral clear to auscultation, no crackles or wheezes.  Heart: Regular rate and rhythm, S1 and S2 normal, no murmurs, rubs auscultated Abdomen: BS normoactive,soft,nondistended,non-tender to palpation,no organomegaly Extremities: No pretibial edema, no erythema, no cyanosis, no clubbing  Neuro : Somnolent but arousable       Data Reviewed: I have personally reviewed following labs and imaging studies  CBG: Recent Labs  Lab 06/30/17 1003 07/01/17 0958  GLUCAP 80 78    CBC: Recent Labs  Lab 06/29/17 1811 06/29/17 1845 07/01/17 0406  WBC 10.9*  --  13.1*  NEUTROABS 9.0*  --   --   HGB 13.8 15.0 12.4  HCT 42.8 44.0 40.7  MCV 84.4  --  86.8  PLT 657*  --  598*    Basic Metabolic Panel: Recent Labs  Lab 06/29/17 1811  06/29/17 1845 07/01/17 0406  NA 134* 136 137  K 4.5 4.4 4.2  CL 102 103 106  CO2 24  --  26  GLUCOSE 114* 115* 86  BUN 22* 21* 17  CREATININE 0.89 0.90 0.89  CALCIUM 11.1*  --  10.4*    Recent Results (from the past 240 hour(s))  Urine Culture     Status: Abnormal   Collection Time: 06/29/17  6:11 PM  Result Value Ref Range Status   Specimen Description URINE, CLEAN CATCH  Final   Special Requests NONE  Final   Culture MULTIPLE SPECIES PRESENT, SUGGEST RECOLLECTION (A)  Final   Report Status 07/01/2017 FINAL  Final  Culture, blood (Routine X 2) w Reflex to ID Panel     Status: None (Preliminary result)   Collection Time: 06/30/17  1:27 AM  Result Value Ref Range Status   Specimen Description BLOOD LEFT FOREARM  Final   Special Requests   Final    BOTTLES DRAWN AEROBIC AND ANAEROBIC Blood Culture adequate volume   Culture   Final    NO GROWTH 1 DAY Performed at [facility], [address], [place], [place] [address]    Report Status PENDING  Incomplete  Culture, blood (Routine X 2) w Reflex to ID Panel     Status: None (Preliminary result)   Collection Time: 06/30/17  1:36 AM  Result Value Ref Range Status   Specimen Description BLOOD RIGHT HAND  Final   Special Requests   Final    BOTTLES DRAWN AEROBIC AND ANAEROBIC Blood Culture adequate volume   Culture   Final    NO GROWTH 1 DAY Performed at [facility], [address], [place], [place] [address]    Report Status PENDING  Incomplete  MRSA PCR Screening     Status: None   Collection Time: 06/30/17  8:55 PM  Result Value Ref Range Status   MRSA by PCR NEGATIVE NEGATIVE Final    Comment:        The GeneXpert MRSA Assay (FDA approved for NASAL specimens only), is one component of a comprehensive MRSA colonization surveillance program. It is not intended to diagnose MRSA infection nor to guide or monitor treatment for MRSA infections.      Liver Function Tests: Recent Labs  Lab 06/29/17 1811  07/01/17 0406  AST 16 14*  ALT 10* 10*  ALKPHOS 61 48  BILITOT 0.8 0.9  PROT 7.8 6.4*  ALBUMIN 3.9 3.3*   Recent Labs  Lab 06/29/17 1811  LIPASE 26   No results for input(s): AMMONIA in the last 168 hours.  Cardiac Enzymes: Recent Labs  Lab 06/30/17 0018 06/30/17 0603 06/30/17 1320  TROPONINI <0.03 <0.03 0.06*   BNP (last 3 results) Recent Labs    06/30/17 0017  BNP 152.4*    ProBNP (last 3 results) No results for input(s): PROBNP in  the last 8760 hours.    Studies: Ct Abdomen Pelvis W Contrast  Result Date: 06/29/2017 CLINICAL DATA:  83 year old female with abdominal and pelvic distention. EXAM: CT ABDOMEN AND PELVIS WITH CONTRAST TECHNIQUE: Multidetector CT imaging of the abdomen and pelvis was performed using the standard protocol following bolus administration of intravenous contrast. CONTRAST:  100 cc intravenous Isovue-300 COMPARISON:  None. FINDINGS: Lower chest: No acute abnormality. Mild right lower lobe cylindrical bronchiectasis and scarring identified. Hepatobiliary: The liver is unremarkable except for hepatic cysts. Gallbladder sludge versus cholelithiasis noted without CT evidence of acute cholecystitis. There is no evidence of biliary dilatation. Pancreas: Unremarkable Spleen: Unremarkable Adrenals/Urinary Tract: Mild circumferential bladder wall thickening is nonspecific but may represent cystitis. A punctate nonobstructing right upper pole renal calculus noted. No obstructing urinary calculi are identified. The adrenal glands are unremarkable. Stomach/Bowel: A moderate hiatal hernia is noted. There is no evidence of bowel obstruction. Colonic diverticulosis noted without evidence of diverticulitis. A moderate to large amount of stool within the distal sigmoid colon noted. There may be circumferential wall thickening of the rectum present. Vascular/Lymphatic: Aortic atherosclerosis. No enlarged abdominal or pelvic lymph nodes. Reproductive: Uterus and bilateral  adnexa are unremarkable. Other: No ascites, abscess or pneumoperitoneum. Musculoskeletal: No acute bony abnormalities. No suspicious bony lesions noted. IMPRESSION: 1. Question circumferential rectal wall thickening which may represent proctitis or possibly rectal mass. Consider direct inspection/physical examination. No evidence of bowel obstruction. Moderate to large amount of distal sigmoid stool. 2. Gallbladder sludge versus cholelithiasis. No CT evidence of acute cholecystitis. 3. Mild circumferential bladder wall thickening of uncertain chronicity. Correlate with UTI/cystitis. 4. Moderate hiatal hernia 5. Punctate nonobstructing right renal calculus 6. Right lower lobe bronchiectasis and scarring 7.  Aortic Atherosclerosis (ICD10-I70.0). Electronically Signed   By: Harmon PierJeffrey  Hu M.D.   On: 06/29/2017 23:33    Scheduled Meds: . acetaminophen  500 mg Oral TID  . aspirin EC  81 mg Oral QHS  . buPROPion  150 mg Oral Daily  . dorzolamide-timolol  1 drop Both Eyes 2 times per day  . enoxaparin (LOVENOX) injection  40 mg Subcutaneous Daily  . escitalopram  10 mg Oral Daily  . latanoprost  1 drop Both Eyes QHS  . levothyroxine  50 mcg Oral QAC breakfast  . metoprolol tartrate  2.5 mg Intravenous Q8H  . montelukast  10 mg Oral QHS  . MUSCLE RUB   Topical QHS  . pantoprazole (PROTONIX) IV  40 mg Intravenous Q12H      Time spent: 25 minutes  Meredeth IdeGagan S Neelam Tiggs   [facility] Pager [phone]. If 7PM-7AM, please contact night-coverage at [email or web address], Office  [phone]  password [email or web address]  07/01/2017, 1:54 PM  LOS: 1 day

## 2017-07-01 NOTE — Interval H&P Note (Signed)
History and Physical Interval Note:  07/01/2017 8:21 AM  Julie Francis  has presented today for surgery, with the diagnosis of rectal mass, nausea/vomiting  The various methods of treatment have been discussed with the patient and family. After consideration of risks, benefits and other options for treatment, the patient has consented to  Procedure(s): ESOPHAGOGASTRODUODENOSCOPY (EGD) WITH PROPOFOL (N/A) FLEXIBLE SIGMOIDOSCOPY (N/A) as a surgical intervention .  The patient's history has been reviewed, patient examined, no change in status, stable for surgery.  I have reviewed the patient's chart and labs.  Questions were answered to the patient's satisfaction.     Echo Propp C.

## 2017-07-02 ENCOUNTER — Encounter (HOSPITAL_COMMUNITY): Payer: Self-pay | Admitting: Gastroenterology

## 2017-07-02 LAB — GLUCOSE, CAPILLARY
GLUCOSE-CAPILLARY: 67 mg/dL (ref 65–99)
Glucose-Capillary: 64 mg/dL — ABNORMAL LOW (ref 65–99)
Glucose-Capillary: 102 mg/dL — ABNORMAL HIGH (ref 65–99)

## 2017-07-02 LAB — CBC
HCT: 38.4 % (ref 36.0–46.0)
HEMOGLOBIN: 11.6 g/dL — AB (ref 12.0–15.0)
MCH: 26.7 pg (ref 26.0–34.0)
MCHC: 30.2 g/dL (ref 30.0–36.0)
MCV: 88.3 fL (ref 78.0–100.0)
PLATELETS: 529 10*3/uL — AB (ref 150–400)
RBC: 4.35 MIL/uL (ref 3.87–5.11)
RDW: 14.9 % (ref 11.5–15.5)
WBC: 11.4 10*3/uL — AB (ref 4.0–10.5)

## 2017-07-02 LAB — COMPREHENSIVE METABOLIC PANEL
ALK PHOS: 47 U/L (ref 38–126)
ALT: 8 U/L — AB (ref 14–54)
AST: 11 U/L — AB (ref 15–41)
Albumin: 3.1 g/dL — ABNORMAL LOW (ref 3.5–5.0)
Anion gap: 6 (ref 5–15)
BUN: 24 mg/dL — AB (ref 6–20)
CALCIUM: 10.5 mg/dL — AB (ref 8.9–10.3)
CHLORIDE: 107 mmol/L (ref 101–111)
CO2: 24 mmol/L (ref 22–32)
CREATININE: 0.96 mg/dL (ref 0.44–1.00)
GFR calc non Af Amer: 50 mL/min — ABNORMAL LOW (ref >60)
GFR, EST AFRICAN AMERICAN: 58 mL/min — AB (ref >60)
GLUCOSE: 68 mg/dL (ref 65–99)
Potassium: 4.1 mmol/L (ref 3.5–5.1)
Sodium: 137 mmol/L (ref 135–145)
Total Bilirubin: 1 mg/dL (ref 0.3–1.2)
Total Protein: 6.2 g/dL — ABNORMAL LOW (ref 6.5–8.1)

## 2017-07-02 LAB — TROPONIN I: Troponin I: <0.03 ng/mL (ref <0.03)

## 2017-07-02 MED ORDER — SODIUM CHLORIDE 0.9 % IV SOLN
INTRAVENOUS | Status: DC
  Administered 2017-07-02 – 2017-07-03 (×2): via INTRAVENOUS

## 2017-07-02 MED ORDER — DEXTROSE-NACL 5-0.45 % IV SOLN: INTRAVENOUS | Status: DC

## 2017-07-02 MED ORDER — DEXTROSE 50 % IV SOLN
INTRAVENOUS | Status: AC
  Administered 2017-07-02: 25 mL
  Filled 2017-07-02: qty 50

## 2017-07-02 MED ORDER — METOPROLOL TARTRATE 25 MG PO TABS
25.0000 mg | ORAL_TABLET | Freq: Two times a day (BID) | ORAL | Status: DC
  Administered 2017-07-02 – 2017-07-03 (×3): 25 mg via ORAL
  Filled 2017-07-02 (×4): qty 1

## 2017-07-02 MED ORDER — HYDRALAZINE HCL 10 MG PO TABS
10.0000 mg | ORAL_TABLET | Freq: Three times a day (TID) | ORAL | Status: DC
  Administered 2017-07-02 – 2017-07-04 (×6): 10 mg via ORAL
  Filled 2017-07-02 (×5): qty 1

## 2017-07-02 MED ORDER — DEXTROSE-NACL 5-0.45 % IV SOLN
INTRAVENOUS | Status: DC
  Filled 2017-07-02: qty 1000

## 2017-07-02 MED ORDER — FLEET ENEMA 7-19 GM/118ML RE ENEM
1.0000 | ENEMA | Freq: Once | RECTAL | Status: AC
  Administered 2017-07-02: 1 via RECTAL
  Filled 2017-07-02: qty 1

## 2017-07-02 NOTE — Progress Notes (Addendum)
[facility]  PROGRESS NOTE  Julie Francis ZOX:[id number] DOB: 1922-09-04 DOA: 06/29/2017 PCP: Angela Cox, MD   Brief HPI:  83 y.o. female with medical history significant of hypertension, COPD, GERD, hypothyroidism, CHF, bradycardia aneurysm, depression, who presents with nausea, vomiting, lower abdominal pain and increased urinary frequency   Subjective   Patient seen and examined, drowsy after patient underwent flexible sigmoidoscopy.   Assessment/Plan:    1. ? UTI-UA positive for nitrite, patient started on Zosyn.  Urine culture grew multiple species.  Zosyn was discontinued 2. Abdominal pain-likely from proctitis. Continue IV Zosyn. Zofran when necessary for nausea and vomiting. 3. Dysphagia-EGD showed impacted food which was removed,  swallow evaluation done today.  Recommend dysphagia 2 diet with thin liquids. 4. Candida esophagitis-seen on EGD, started on IV Diflucan. 5. Elevated troponin-elevation of troponin, likely from demand ischemia from hypertension.  Will repeat troponin. EKG showed NSR, no ST changes. 6. Fecal impaction-proctitis seen on flexible sigmoidoscopy, likely from constipation.  Tap water enema did not resulted in BM.  Fleets enema to be given today as per GI. 7.  Rectal mass- patient underwent flexible sigmoidoscopy, which showed congested plaque-like mass in the rectum.  Biopsy obtained.  GI following 8. Hypercalcemia- acid was 11.1, PTH ordered. Continue IV fluids.  Today calcium is 10.5, albumin is 3.1.  Today calcium 11.2.  Check serum calcium in a.m. 9. Hypothyroidism-continue Synthroid 10. Hypertension- blood pressure stable, continue hydralazine.  We will start p.o. metoprolol 11. Depression-continue with Wellbutrin, Lexapro    DVT prophylaxis: Lovenox  Code Status: DO NOT RESUSCITATE  Family Communication: Discussed with patient's son at bedside   Disposition Plan: Likely skilled facility in next 2-3  days   Consultants:  Gastroenterology  Procedures:  None  Continuous infusions . dextrose 5 % and 0.45% NaCl    . fluconazole (DIFLUCAN) IV 200 mg (07/02/17 1200)  . piperacillin-tazobactam (ZOSYN)  IV Stopped (07/02/17 1355)      Antibiotics:   Anti-infectives (From admission, onward)   Start     Dose/Rate Route Frequency Ordered Stop   07/01/17 1200  fluconazole (DIFLUCAN) IVPB 200 mg     200 mg 100 mL/hr over 60 Minutes Intravenous Every 24 hours 07/01/17 1027     06/30/17 2300  cefTRIAXone (ROCEPHIN) 1 g in dextrose 5 % 50 mL IVPB  Status:  Discontinued     1 g 100 mL/hr over 30 Minutes Intravenous  Once 06/30/17 0016 06/30/17 0208   06/30/17 0400  piperacillin-tazobactam (ZOSYN) IVPB 3.375 g     3.375 g 12.5 mL/hr over 240 Minutes Intravenous Every 8 hours 06/30/17 0218     06/29/17 2300  cefTRIAXone (ROCEPHIN) 1 g in dextrose 5 % 50 mL IVPB     1 g 100 mL/hr over 30 Minutes Intravenous  Once 06/29/17 2253 06/29/17 2340       Objective   Vitals:   07/01/17 1418 07/01/17 2040 07/02/17 0529 07/02/17 1331  BP: (!) 149/62 (!) 128/55 (!) 157/54 (!) 149/62  Pulse: 63 65 (!) 59 65  Resp: 18 20 20 16   Temp: 99 F (37.2 C) 99.1 F (37.3 C) 98.6 F (37 C) 98.5 F (36.9 C)  TempSrc: Oral Oral Oral Axillary  SpO2: 100% 100% 97% 99%  Weight:   65.1 kg (143 lb 8.3 oz)   Height:        Intake/Output Summary (Last 24 hours) at 07/02/2017 1447 Last data filed at 07/02/2017 1400 Gross per 24 hour  Intake 150 ml  Output 1 ml  Net 149 ml   Filed Weights   07/01/17 0422 07/01/17 0728 07/02/17 0529  Weight: 65.3 kg (143 lb 15.4 oz) 64.9 kg (143 lb) 65.1 kg (143 lb 8.3 oz)     Physical Examination:  Physical Exam: Eyes: No icterus, extraocular muscles intact  Mouth: Oral mucosa is moist, no lesions on palate,  Neck: Supple, no deformities, masses, or tenderness Lungs: Normal respiratory effort, bilateral clear to auscultation, no crackles or wheezes.  Heart:  Regular rate and rhythm, S1 and S2 normal, no murmurs, rubs auscultated Abdomen: BS normoactive,soft,nondistended,non-tender to palpation,no organomegaly Extremities: No pretibial edema, no erythema, no cyanosis, no clubbing Neuro : Alert and oriented to time, place and person, No focal deficits Skin: No rashes seen on exam       Data Reviewed: I have personally reviewed following labs and imaging studies  CBG: Recent Labs  Lab 06/30/17 1003 07/01/17 0958 07/02/17 0758 07/02/17 1324 07/02/17 1354  GLUCAP 80 78 64* 67 102*    CBC: Recent Labs  Lab 06/29/17 1811 06/29/17 1845 07/01/17 0406 07/02/17 0531  WBC 10.9*  --  13.1* 11.4*  NEUTROABS 9.0*  --   --   --   HGB 13.8 15.0 12.4 11.6*  HCT 42.8 44.0 40.7 38.4  MCV 84.4  --  86.8 88.3  PLT 657*  --  598* 529*    Basic Metabolic Panel: Recent Labs  Lab 06/29/17 1811 06/29/17 1845 07/01/17 0406 07/02/17 0531  NA 134* 136 137 137  K 4.5 4.4 4.2 4.1  CL 102 103 106 107  CO2 24  --  26 24  GLUCOSE 114* 115* 86 68  BUN 22* 21* 17 24*  CREATININE 0.89 0.90 0.89 0.96  CALCIUM 11.1*  --  10.4* 10.5*    Recent Results (from the past 240 hour(s))  Urine Culture     Status: Abnormal   Collection Time: 06/29/17  6:11 PM  Result Value Ref Range Status   Specimen Description URINE, CLEAN CATCH  Final   Special Requests NONE  Final   Culture MULTIPLE SPECIES PRESENT, SUGGEST RECOLLECTION (A)  Final   Report Status 07/01/2017 FINAL  Final  Culture, blood (Routine X 2) w Reflex to ID Panel     Status: None (Preliminary result)   Collection Time: 06/30/17  1:27 AM  Result Value Ref Range Status   Specimen Description BLOOD LEFT FOREARM  Final   Special Requests   Final    BOTTLES DRAWN AEROBIC AND ANAEROBIC Blood Culture adequate volume   Culture   Final    NO GROWTH 1 DAY Performed at [facility], [address] [place], [place] [address]    Report Status PENDING  Incomplete  Culture, blood (Routine X  2) w Reflex to ID Panel     Status: None (Preliminary result)   Collection Time: 06/30/17  1:36 AM  Result Value Ref Range Status   Specimen Description BLOOD RIGHT HAND  Final   Special Requests   Final    BOTTLES DRAWN AEROBIC AND ANAEROBIC Blood Culture adequate volume   Culture   Final    NO GROWTH 1 DAY Performed at [facility], [address] [place], [place] [address]    Report Status PENDING  Incomplete  MRSA PCR Screening     Status: None   Collection Time: 06/30/17  8:55 PM  Result Value Ref Range Status   MRSA by PCR NEGATIVE NEGATIVE Final  Comment:        The GeneXpert MRSA Assay (FDA approved for NASAL specimens only), is one component of a comprehensive MRSA colonization surveillance program. It is not intended to diagnose MRSA infection nor to guide or monitor treatment for MRSA infections.      Liver Function Tests: Recent Labs  Lab 06/29/17 1811 07/01/17 0406 07/02/17 0531  AST 16 14* 11*  ALT 10* 10* 8*  ALKPHOS 61 48 47  BILITOT 0.8 0.9 1.0  PROT 7.8 6.4* 6.2*  ALBUMIN 3.9 3.3* 3.1*   Recent Labs  Lab 06/29/17 1811  LIPASE 26   No results for input(s): AMMONIA in the last 168 hours.  Cardiac Enzymes: Recent Labs  Lab 06/30/17 0018 06/30/17 0603 06/30/17 1320  TROPONINI <0.03 <0.03 0.06*   BNP (last 3 results) Recent Labs    06/30/17 0017  BNP 152.4*    ProBNP (last 3 results) No results for input(s): PROBNP in the last 8760 hours.    Studies: No results found.  Scheduled Meds: . acetaminophen  500 mg Oral TID  . aspirin EC  81 mg Oral QHS  . buPROPion  150 mg Oral Daily  . dorzolamide-timolol  1 drop Both Eyes 2 times per day  . enoxaparin (LOVENOX) injection  40 mg Subcutaneous Daily  . escitalopram  10 mg Oral Daily  . latanoprost  1 drop Both Eyes QHS  . levothyroxine  50 mcg Oral QAC breakfast  . metoprolol tartrate  2.5 mg Intravenous Q8H  . montelukast  10 mg Oral QHS  . MUSCLE RUB   Topical QHS       Time spent: 25 minutes  Meredeth IdeGagan S Daziah Hesler   [facility] Pager [phone]. If 7PM-7AM, please contact night-coverage at [email or web address], Office  [phone]  password [email or web address]  07/02/2017, 2:47 PM  LOS: 2 days

## 2017-07-02 NOTE — Progress Notes (Signed)
PT Cancellation Note  Patient Details Name: Julie Francis MRN: [id number] DOB: [birth year]   Cancelled Treatment:     PT order received and eval attempted x 3 but deferred 2* need for Troponin level clarification, Enema in progress, and SLP eval in progress.  Will follow.   Lymon Kidney 07/02/2017, 2:18 PM

## 2017-07-02 NOTE — Evaluation (Signed)
Clinical/Bedside Swallow Evaluation Patient Details  Name: Julie Francis MRN: [id number] Date of Birth: 1922/10/04  Today's Date: 07/02/2017 Time: SLP Start Time (ACUTE ONLY): 1420 SLP Stop Time (ACUTE ONLY): 1450 SLP Time Calculation (min) (ACUTE ONLY): 30 min  Past Medical History:  Past Medical History:  Diagnosis Date  . Arthritis   . Brain aneurysm 1999   no medical follow up needed  . COPD (chronic obstructive pulmonary disease) (HCC)   . High cholesterol   . Hypertension   . Overactive bladder   . Thyroid disease    Past Surgical History:  Past Surgical History:  Procedure Laterality Date  . APPENDECTOMY    . ESOPHAGOGASTRODUODENOSCOPY (EGD) WITH PROPOFOL N/A 07/01/2017   Procedure: ESOPHAGOGASTRODUODENOSCOPY (EGD) WITH PROPOFOL;  Surgeon: Charlott RakesSchooler, Vincent, MD;  Location: [facility];  Service: Endoscopy;  Laterality: N/A;  . FLEXIBLE SIGMOIDOSCOPY N/A 07/01/2017   Procedure: FLEXIBLE SIGMOIDOSCOPY;  Surgeon: Charlott RakesSchooler, Vincent, MD;  Location: [facility];  Service: Endoscopy;  Laterality: N/A;  . TONSILLECTOMY    . TUBAL LIGATION     HPI:  83 year old female admitted 06/29/17 with back pain and 2 day history of regurgitation. PMH significant for COPD, GERD, brain aneurysm, HTN. EGD 07-01-17 revealed candidiasis, medium hiatal hernia, food in esophagus.   Assessment / Plan / Recommendation Clinical Impression  Oral care was completed with suction. Pt had significant thick mucus caked on to hard palate, and very sensitive gag reflex. Oral cavity cleared of excess mucus. Pt accepted trials of thin liquid, puree, and solid consistencies. All presentations were tolerated well, without overt s/s aspiration or decline in respiratory status. Given history of GERD and EGD findings, will begin conservative diet of dys 2 solids with thin liquids.  ST will follow for assessment of diet tolerance and to continue education. RN and MD informed of results and recommendations.     SLP  Visit Diagnosis: Dysphagia, unspecified (R13.10)    Aspiration Risk  Mild aspiration risk    Diet Recommendation Dysphagia 2 (Fine chop);Thin liquid   Liquid Administration via: Cup;Straw Medication Administration: Whole meds with liquid Supervision: Patient able to self feed;Full supervision/cueing for compensatory strategies Compensations: Minimize environmental distractions;Small sips/bites;Slow rate;Follow solids with liquid(begin meals with warm beverage) Postural Changes: Seated upright at 90 degrees;Remain upright for at least 30 minutes after po intake    Other  Recommendations Oral Care Recommendations: Oral care QID Other Recommendations: Have oral suction available   Follow up Recommendations (TBD)      Frequency and Duration min 1 x/week  1 week;2 weeks       Prognosis Prognosis for Safe Diet Advancement: Fair      Swallow Study   General HPI: 83 year old female admitted 06/29/17 with back pain. PMh significant for COPD, GERD,  Type of Study: Bedside Swallow Evaluation Previous Swallow Assessment: none found Diet Prior to this Study: NPO Temperature Spikes Noted: No Respiratory Status: Nasal cannula History of Recent Intubation: No Behavior/Cognition: Alert;Cooperative;Pleasant mood(HOH) Oral Cavity Assessment: Dried secretions Oral Care Completed by SLP: Yes Oral Cavity - Dentition: Missing dentition;Poor condition Vision: Functional for self-feeding Self-Feeding Abilities: Able to feed self Patient Positioning: Upright in bed Baseline Vocal Quality: Normal Volitional Cough: Weak Volitional Swallow: Able to elicit    Oral/Motor/Sensory Function Overall Oral Motor/Sensory Function: Within functional limits   Ice Chips Ice chips: Within functional limits Presentation: Spoon   Thin Liquid Thin Liquid: Within functional limits Presentation: Cup;Straw;Self Fed    Nectar Thick Nectar Thick Liquid: Not tested  Honey Thick Honey Thick Liquid: Not tested    Puree Puree: Within functional limits Presentation: Spoon   Solid   GO   Solid: Within functional limits Presentation: Self Fed       Amiya Escamilla B. Murvin NatalBueche, [facility]P, CCC-SLP Speech Language Pathologist [phone]  Julie Francis, Julie Francis 07/02/2017,3:02 PM

## 2017-07-02 NOTE — Progress Notes (Signed)
[facility]  Julie Francis 83 y.o. [birth year]   Subjective: No BMs overnight despite enemas. Resting comfortably but easily arousable. Denies abdominal pain. Son at bedside.  Objective: Vital signs: Vitals:   07/01/17 2040 07/02/17 0529  BP: (!) 128/55 (!) 157/54  Pulse: 65 (!) 59  Resp: 20 20  Temp: 99.1 F (37.3 C) 98.6 F (37 C)  SpO2: 100% 97%    Physical Exam: Gen: elderly, frail, lethargic, no acute distress  HEENT: anicteric sclera CV: RRR Chest: CTA B Abd: diffuse tenderness with guarding, soft, nondistended, +BS  Lab Results: Recent Labs    07/01/17 0406 07/02/17 0531  NA 137 137  K 4.2 4.1  CL 106 107  CO2 26 24  GLUCOSE 86 68  BUN 17 24*  CREATININE 0.89 0.96  CALCIUM 10.4* 10.5*   Recent Labs    07/01/17 0406 07/02/17 0531  AST 14* 11*  ALT 10* 8*  ALKPHOS 48 47  BILITOT 0.9 1.0  PROT 6.4* 6.2*  ALBUMIN 3.3* 3.1*   Recent Labs    06/29/17 1811  07/01/17 0406 07/02/17 0531  WBC 10.9*  --  13.1* 11.4*  NEUTROABS 9.0*  --   --   --   HGB 13.8   < > 12.4 11.6*  HCT 42.8   < > 40.7 38.4  MCV 84.4  --  86.8 88.3  PLT 657*  --  598* 529*   < > = values in this interval not displayed.      Assessment/Plan: Fecal impaction - proctitis seen on flex sig likely from constipation - path pending; Food in esophagus removed with Lucina Mellowoth net; Speech path evaluation pending; Repeat enemas but will give Fleet enemas instead of tap water since no results yesterday. May need disimpaction by nursing if no results today. Continue supportive care. NPO until cleared by speech path.   Julie Angulo C. 07/02/2017, 11:37 AM  Pager [phone]  AFTER 5 PM or on weekends please call [phone] ID: Julie BannerAdelaide Francis, female   DOB: [birth year], 83 y.o.   MRN: [id number]

## 2017-07-02 NOTE — Plan of Care (Signed)
  No Outcome SLP Dysphagia Goals Patient will utilize recommended strategies Description Patient will utilize recommended strategies during swallow to increase swallowing safety with 07/02/2017 1448 by Wanda PlumpBueche, Ahlivia Salahuddin B, CCC-SLP Flowsheets Taken 07/02/2017 1448  Patient will utilize recommended strategies during swallow to increase swallowing safety with  min assist Misc Dysphagia Goal 07/02/2017 1449 by Wanda PlumpBueche, June Vacha B, CCC-SLP Flowsheets Taken 07/02/2017 1449  Misc Dysphagia Goal  Pt will tolerate least restrictive diet without overt s/s aspiration or decline in respiratory status.

## 2017-07-03 DIAGNOSIS — R1010 Upper abdominal pain, unspecified: Secondary | ICD-10-CM

## 2017-07-03 LAB — TROPONIN I

## 2017-07-03 LAB — GLUCOSE, CAPILLARY: Glucose-Capillary: 101 mg/dL — ABNORMAL HIGH (ref 65–99)

## 2017-07-03 MED ORDER — POLYETHYLENE GLYCOL 3350 17 G PO PACK
17.0000 g | PACK | Freq: Two times a day (BID) | ORAL | Status: DC
  Administered 2017-07-03 – 2017-07-04 (×3): 17 g via ORAL
  Filled 2017-07-03 (×3): qty 1

## 2017-07-03 MED ORDER — FLUCONAZOLE 100 MG PO TABS
200.0000 mg | ORAL_TABLET | Freq: Every day | ORAL | Status: DC
  Administered 2017-07-03 – 2017-07-04 (×2): 200 mg via ORAL
  Filled 2017-07-03 (×2): qty 2

## 2017-07-03 MED ORDER — FUROSEMIDE 10 MG/ML IJ SOLN
40.0000 mg | Freq: Once | INTRAMUSCULAR | Status: AC
  Administered 2017-07-03: 40 mg via INTRAVENOUS
  Filled 2017-07-03: qty 4

## 2017-07-03 NOTE — Evaluation (Signed)
Physical Therapy Evaluation Patient Details Name: Julie Francis MRN: [id number] DOB: 1923/01/11 Today's Date: 07/03/2017   History of Present Illness  83 yo female admitted with acute lower UTI, dysphagia, proctitis. Hx of HTN, COPD, CHF, bradycardia.   Clinical Impression  On eval, pt required Max assist for mobility. Sat pt EOB for ~ minutes with Mod assist for sitting balance. Repeated LOB while sitting EOB. Pt is very weak and unable to sit unsupported at this time. Bowel and bladder incontinence noted. Assisted pt back to bed. NT in to assist with hygiene. Recommend SNF.     Follow Up Recommendations SNF    Equipment Recommendations  None recommended by PT    Recommendations for Other Services       Precautions / Restrictions Precautions Precautions: Fall Restrictions Weight Bearing Restrictions: No      Mobility  Bed Mobility Overal bed mobility: Needs Assistance Bed Mobility: Rolling;Supine to Sit;Sit to Supine Rolling: Max assist   Supine to sit: Mod assist;HOB elevated Sit to supine: Mod assist;HOB elevated   General bed mobility comments: Min assist to roll to R side, Max assist to roll to L side. Mod assist supine<>sit with HOB elevated. Increased time. Heavy reliance on bedrail. Pt unable to sit unsupported  Transfers                 General transfer comment: NT-pt unable/too weak  Ambulation/Gait                Stairs            Wheelchair Mobility    Modified Rankin (Stroke Patients Only)       Balance Overall balance assessment: Needs assistance Sitting-balance support: Feet unsupported;Bilateral upper extremity supported   Sitting balance - Comments: Mod assist for static sitting balance. Pt unable to sit unsupported. Repeated LOB.  Postural control: Posterior lean;Right lateral lean                                   Pertinent Vitals/Pain Pain Assessment: Faces Faces Pain Scale: Hurts little more Pain  Location: generalized pain Pain Intervention(s): Monitored during session;Repositioned    Home Living Family/patient expects to be discharged to:: Unsure     Type of Home: Assisted living Home Access: Level entry     Home Layout: One level Home Equipment: Walker - 4 wheels      Prior Function           Comments: per son, pt is nonambulatory. She is able to transfer from bed >wc/bsc with assistance     Hand Dominance        Extremity/Trunk Assessment   Upper Extremity Assessment Upper Extremity Assessment: Generalized weakness    Lower Extremity Assessment Lower Extremity Assessment: Generalized weakness    Cervical / Trunk Assessment Cervical / Trunk Assessment: Kyphotic  Communication   Communication: HOH  Cognition Arousal/Alertness: Awake/alert Behavior During Therapy: WFL for tasks assessed/performed Overall Cognitive Status: Within Functional Limits for tasks assessed                                        General Comments      Exercises     Assessment/Plan    PT Assessment Patient needs continued PT services  PT Problem List Decreased strength;Decreased mobility;Decreased activity tolerance;Decreased balance;Decreased knowledge of use of  DME;Pain       PT Treatment Interventions Functional mobility training;Therapeutic activities;Balance training;Patient/family education;Therapeutic exercise    PT Goals (Current goals can be found in the Care Plan section)  Acute Rehab PT Goals Patient Stated Goal: none stated PT Goal Formulation: With patient Time For Goal Achievement: 07/17/17 Potential to Achieve Goals: Fair    Frequency Min 2X/week   Barriers to discharge        Co-evaluation               AM-PAC PT "6 Clicks" Daily Activity  Outcome Measure Difficulty turning over in bed (including adjusting bedclothes, sheets and blankets)?: Unable Difficulty moving from lying on back to sitting on the side of the bed?  : Unable Difficulty sitting down on and standing up from a chair with arms (e.g., wheelchair, bedside commode, etc,.)?: Unable Help needed moving to and from a bed to chair (including a wheelchair)?: Total Help needed walking in hospital room?: Total Help needed climbing 3-5 steps with a railing? : Total 6 Click Score: 6    End of Session   Activity Tolerance: Patient limited by fatigue Patient left: in bed;with call bell/phone within reach;with bed alarm set   PT Visit Diagnosis: Muscle weakness (generalized) (M62.81);Difficulty in walking, not elsewhere classified (R26.2)    Time: [id number] PT Time Calculation (min) (ACUTE ONLY): 16 min   Charges:   PT Evaluation $PT Eval Moderate Complexity: 1 Mod     PT G Codes:          [name], MPT Pager: [phone]

## 2017-07-03 NOTE — Progress Notes (Signed)
PT Cancellation Note  Patient Details Name: Julie Francis MRN: [id number] DOB: September 22, 1922   Cancelled Treatment:    Reason Eval/Treat Not Completed: Attempted PT eval. Family brought in breakfast at start of session. Will check back as schedule allows.    Rebeca AlertJannie Dwan Hemmelgarn, MPT Pager: [phone]

## 2017-07-03 NOTE — Progress Notes (Signed)
SLP Cancellation Note  Patient Details Name: Julie Francis MRN: [id number] DOB: 06/04/1923   Cancelled treatment:       Reason Eval/Treat Not Completed: Other (comment)(working with PT, will continue efforts)   Chales AbrahamsKimball, Hussein Macdougal Ann 07/03/2017, 9:15 AM  Donavan Burnetamara Mischell Branford, MS [facility] SLP [phone]

## 2017-07-03 NOTE — Progress Notes (Signed)
  Speech Language Pathology Treatment: Dysphagia  Patient Details Name: Julie Francis MRN: [id number] DOB: 1922-09-09 Today's Date: 07/03/2017 Time: 9276-3943 SLP Time Calculation (min) (ACUTE ONLY): 13 min  Assessment / Plan / Recommendation Clinical Impression  Pt seen to assess po tolerance of dys2/thin diet.  Pt denies dysphagia and requested coffee.  Provided her with coffee via straw.  Pt unable to completed 3 ounce water test due to requiring rest for breathing after approximately 1 ounce.  Reviewed aspiration/esophageal precautions with pt to mitigate dysphagia symptoms.  Recommend advance diet as GI indicates given pt s/p endoscopy.  Pt observed to belch after approximately 8 boluses - followed by subtle cough.  She demonstrated decreased awareness to this but was advised to monitor for symptoms of refluxing reaching the pharynx or larynx.  SLP educated to precautions using teach back.        HPI HPI: 83 year old female admitted 06/29/17 with back pain. PMh significant for COPD, GERD.  Pt is s/p endoscopy which showed food debris in esophagus and medium hiatal hernia, candidiasis.  Pt seen by SLP for swallow eval and placed on dys2/thin diet.  Today pt denies dysphagia, sensing food in oropharynx or esophagus nor refluxing.        SLP Plan  All goals met       Recommendations  Diet recommendations: Thin liquid;Dysphagia 2 (fine chop) Liquids provided via: Straw;Cup Medication Administration: Whole meds with liquid Supervision: Intermittent supervision to cue for compensatory strategies;Patient able to self feed Compensations: Minimize environmental distractions;Small sips/bites;Slow rate;Follow solids with liquid(begin meals with warm beverage) Postural Changes and/or Swallow Maneuvers: Out of bed for meals;Seated upright 90 degrees;Upright 30-60 min after meal                Oral Care Recommendations: Oral care QID Follow up Recommendations: (TBD) SLP Visit Diagnosis:  Dysphagia, unspecified (R13.10) Plan: All goals met       GO              Julie Salk, MS [facility] SLP [phone]   Julie Francis 07/03/2017, 12:29 PM

## 2017-07-03 NOTE — Progress Notes (Signed)
[facility]  Julie Bannerdelaide Francis 83 y.o. 1922-08-02   Subjective: Sitting up in bed. No complaints. Moving bowels with enemas given yesterday. Sister-in-law at bedside.  Objective: Vital signs: Vitals:   07/03/17 0925 07/03/17 1116  BP: 116/60   Pulse: 60   Resp:    Temp:    SpO2:  99%  T 97.6, R 18  Physical Exam: Gen: Lethargic, elderly, frail, no acute distress  HEENT: anicteric sclera CV: RRR Chest: CTA B Abd: softer, less tender, +BS   Lab Results: Recent Labs    07/01/17 0406 07/02/17 0531  NA 137 137  K 4.2 4.1  CL 106 107  CO2 26 24  GLUCOSE 86 68  BUN 17 24*  CREATININE 0.89 0.96  CALCIUM 10.4* 10.5*   Recent Labs    07/01/17 0406 07/02/17 0531  AST 14* 11*  ALT 10* 8*  ALKPHOS 48 47  BILITOT 0.9 1.0  PROT 6.4* 6.2*  ALBUMIN 3.3* 3.1*   Recent Labs    07/01/17 0406 07/02/17 0531  WBC 13.1* 11.4*  HGB 12.4 11.6*  HCT 40.7 38.4  MCV 86.8 88.3  PLT 598* 529*      Assessment/Plan: Proctitis - likely from fecal impaction. BMs moving with enemas. Agree with Miralax now that liquids ok per speech path. Biopsies were benign and no active inflammation seen. Needs to take Miralax daily at discharge unless diarrhea develops. Will sign off. Call if questions.   Julie Francis C. 07/03/2017, 1:15 PM  Pager [phone]  AFTER 5 PM or on weekends please call [phone] ID: Julie BannerAdelaide Francis, female   DOB: 1922-08-02, 83 y.o.   MRN: [id number]

## 2017-07-03 NOTE — Progress Notes (Signed)
PHARMACIST - PHYSICIAN COMMUNICATION DR:  Julie ParkinsonAbrol et al CONCERNING: Antibiotic IV to Oral Route Change Policy  RECOMMENDATION: This patient is receiving flucaonazole by the intravenous route.  Based on criteria approved by the Pharmacy and Therapeutics Committee, the antibiotic(s) is/are being converted to the equivalent oral dose form(s).   DESCRIPTION: These criteria include:  Patient being treated for a respiratory tract infection, urinary tract infection, cellulitis or clostridium difficile associated diarrhea if on metronidazole  The patient is not neutropenic and does not exhibit a GI malabsorption state  The patient is eating (either orally or via tube) and/or has been taking other orally administered medications for a least 24 hours  The patient is improving clinically and has a Tmax < 100.5  If you have questions about this conversion, please contact the Pharmacy Department  []   [phone]( [phone] )  Julie Francis []   [phone]( [phone] )  Julie Francis []   [phone]( [phone]  Julie Francis []   [phone]( [phone] )  [facility] [x]   [phone]( [phone] )  [facility]  Francis Julie AbrahamMichelle T. Justinn Francis, [place].D. [phone] 07/03/2017 7:53 AM

## 2017-07-03 NOTE — Progress Notes (Addendum)
[facility]  PROGRESS NOTE  Julie Francis ZOX:[id number] DOB: 12-Jul-1923 DOA: 06/29/2017 PCP: Angela Cox, MD   Brief HPI:  83 y.o. female with medical history significant of hypertension, COPD, GERD, hypothyroidism, CHF, bradycardia aneurysm, depression, who presents with nausea, vomiting, lower abdominal pain and increased urinary frequency   Subjective   Patient seen and examined, awake and denies any nausea, vomiting, able to swallow. Signed by the bedside and states that the patient is weak and he would like to SNF   Assessment/Plan:    1. ? UTI-UA positive for nitrite, patient started on Zosyn.  Urine culture grew multiple species.  Zosyn was discontinued 2. Abdominal pain-likely from proctitis. Continue IV Zosyn. Zofran when necessary for nausea and vomiting. 3. Dysphagia-EGD showed impacted food which was removed,  swallow evaluation done today.  Recommend dysphagia 2 diet with thin liquids. 4. Candida esophagitis-seen on EGD, started on  Diflucan, changed to oral. 5. Elevated troponin-elevation of troponin, likely from demand ischemia from hypertension.  Will repeat troponin. EKG showed NSR, no ST changes. 6. Fecal impaction-proctitis seen on flexible sigmoidoscopy, likely from constipation. After receiving Tap water enema patient had another BM.  Did not require disimpaction.   7.  Rectal mass- patient underwent flexible sigmoidoscopy, which showed congested plaque-like mass in the rectum.  Biopsy obtained.  GI following 8. Hypercalcemia- acid was 11.1, PTH ordered. Continue IV fluids.  Today calcium is 10.5, albumin is 3.1.  Today calcium 11.2.  Check serum calcium in a.m. 9. Hypothyroidism-continue Synthroid 10. Hypertension- blood pressure stable, continue hydralazine.  We will start p.o. metoprolol 11. Depression-continue with Wellbutrin, Lexapro    DVT prophylaxis: Lovenox  Code Status: DO NOT RESUSCITATE  Family Communication: Discussed with  patient's son at bedside   Disposition Plan:  SNF in 1-2 days   Consultants:  Gastroenterology  Procedures:  None  Continuous infusions . sodium chloride 100 mL/hr at 07/03/17 0131      Antibiotics:   Anti-infectives (From admission, onward)   Start     Dose/Rate Route Frequency Ordered Stop   07/03/17 1000  fluconazole (DIFLUCAN) tablet 200 mg     200 mg Oral Daily 07/03/17 0752     07/01/17 1200  fluconazole (DIFLUCAN) IVPB 200 mg  Status:  Discontinued     200 mg 100 mL/hr over 60 Minutes Intravenous Every 24 hours 07/01/17 1027 07/03/17 0752   06/30/17 2300  cefTRIAXone (ROCEPHIN) 1 g in dextrose 5 % 50 mL IVPB  Status:  Discontinued     1 g 100 mL/hr over 30 Minutes Intravenous  Once 06/30/17 0016 06/30/17 0208   06/30/17 0400  piperacillin-tazobactam (ZOSYN) IVPB 3.375 g  Status:  Discontinued     3.375 g 12.5 mL/hr over 240 Minutes Intravenous Every 8 hours 06/30/17 0218 07/02/17 1504   06/29/17 2300  cefTRIAXone (ROCEPHIN) 1 g in dextrose 5 % 50 mL IVPB     1 g 100 mL/hr over 30 Minutes Intravenous  Once 06/29/17 2253 06/29/17 2340       Objective   Vitals:   07/02/17 1600 07/02/17 2044 07/03/17 0500 07/03/17 0925  BP: (!) 140/58 (!) 147/53 (!) 135/58 116/60  Pulse:  60 (!) 55 60  Resp:  18 18   Temp:  98.9 F (37.2 C) 97.6 F (36.4 C)   TempSrc:  Oral Oral   SpO2:  100% 96%   Weight:   66.7 kg (147 lb 0.8 oz)   Height:        Intake/Output  Summary (Last 24 hours) at 07/03/2017 1102 Last data filed at 07/03/2017 0600 Gross per 24 hour  Intake 1576.67 ml  Output 4 ml  Net 1572.67 ml   Filed Weights   07/01/17 0728 07/02/17 0529 07/03/17 0500  Weight: 64.9 kg (143 lb) 65.1 kg (143 lb 8.3 oz) 66.7 kg (147 lb 0.8 oz)     Physical Examination:  Physical Exam: Eyes: No icterus, extraocular muscles intact  Mouth: Oral mucosa is moist, no lesions on palate,  Neck: Supple, no deformities, masses, or tenderness Lungs: Normal respiratory effort,  bilateral clear to auscultation, no crackles or wheezes.  Heart: Regular rate and rhythm, S1 and S2 normal, no murmurs, rubs auscultated Abdomen: BS normoactive,soft,nondistended,non-tender to palpation,no organomegaly Extremities: No pretibial edema, no erythema, no cyanosis, no clubbing Neuro : Alert and oriented to time, place and person, No focal deficits Skin: No rashes seen on exam       Data Reviewed: I have personally reviewed following labs and imaging studies  CBG: Recent Labs  Lab 07/01/17 0958 07/02/17 0758 07/02/17 1324 07/02/17 1354 07/03/17 0729  GLUCAP 78 64* 67 102* 101*    CBC: Recent Labs  Lab 06/29/17 1811 06/29/17 1845 07/01/17 0406 07/02/17 0531  WBC 10.9*  --  13.1* 11.4*  NEUTROABS 9.0*  --   --   --   HGB 13.8 15.0 12.4 11.6*  HCT 42.8 44.0 40.7 38.4  MCV 84.4  --  86.8 88.3  PLT 657*  --  598* 529*    Basic Metabolic Panel: Recent Labs  Lab 06/29/17 1811 06/29/17 1845 07/01/17 0406 07/02/17 0531  NA 134* 136 137 137  K 4.5 4.4 4.2 4.1  CL 102 103 106 107  CO2 24  --  26 24  GLUCOSE 114* 115* 86 68  BUN 22* 21* 17 24*  CREATININE 0.89 0.90 0.89 0.96  CALCIUM 11.1*  --  10.4* 10.5*    Recent Results (from the past 240 hour(s))  Urine Culture     Status: Abnormal   Collection Time: 06/29/17  6:11 PM  Result Value Ref Range Status   Specimen Description URINE, CLEAN CATCH  Final   Special Requests NONE  Final   Culture MULTIPLE SPECIES PRESENT, SUGGEST RECOLLECTION (A)  Final   Report Status 07/01/2017 FINAL  Final  Culture, blood (Routine X 2) w Reflex to ID Panel     Status: None (Preliminary result)   Collection Time: 06/30/17  1:27 AM  Result Value Ref Range Status   Specimen Description BLOOD LEFT FOREARM  Final   Special Requests   Final    BOTTLES DRAWN AEROBIC AND ANAEROBIC Blood Culture adequate volume   Culture   Final    NO GROWTH 2 DAYS Performed at [facility], [address] [place], [place]  [id number]    Report Status PENDING  Incomplete  Culture, blood (Routine X 2) w Reflex to ID Panel     Status: None (Preliminary result)   Collection Time: 06/30/17  1:36 AM  Result Value Ref Range Status   Specimen Description BLOOD RIGHT HAND  Final   Special Requests   Final    BOTTLES DRAWN AEROBIC AND ANAEROBIC Blood Culture adequate volume   Culture   Final    NO GROWTH 2 DAYS Performed at [facility], [address] [place], [place] [address]    Report Status PENDING  Incomplete  MRSA PCR Screening     Status: None   Collection  Time: 06/30/17  8:55 PM  Result Value Ref Range Status   MRSA by PCR NEGATIVE NEGATIVE Final    Comment:        The GeneXpert MRSA Assay (FDA approved for NASAL specimens only), is one component of a comprehensive MRSA colonization surveillance program. It is not intended to diagnose MRSA infection nor to guide or monitor treatment for MRSA infections.      Liver Function Tests: Recent Labs  Lab 06/29/17 1811 07/01/17 0406 07/02/17 0531  AST 16 14* 11*  ALT 10* 10* 8*  ALKPHOS 61 48 47  BILITOT 0.8 0.9 1.0  PROT 7.8 6.4* 6.2*  ALBUMIN 3.9 3.3* 3.1*   Recent Labs  Lab 06/29/17 1811  LIPASE 26   No results for input(s): AMMONIA in the last 168 hours.  Cardiac Enzymes: Recent Labs  Lab 06/30/17 0603 06/30/17 1320 07/02/17 1616 07/02/17 2051 07/03/17 0412  TROPONINI <0.03 0.06* <0.03 <0.03 <0.03   BNP (last 3 results) Recent Labs    06/30/17 0017  BNP 152.4*    ProBNP (last 3 results) No results for input(s): PROBNP in the last 8760 hours.    Studies: No results found.  Scheduled Meds: . acetaminophen  500 mg Oral TID  . aspirin EC  81 mg Oral QHS  . buPROPion  150 mg Oral Daily  . dorzolamide-timolol  1 drop Both Eyes 2 times per day  . enoxaparin (LOVENOX) injection  40 mg Subcutaneous Daily  . escitalopram  10 mg Oral Daily  . fluconazole  200 mg Oral Daily  . hydrALAZINE  10 mg Oral Q8H  .  latanoprost  1 drop Both Eyes QHS  . levothyroxine  50 mcg Oral QAC breakfast  . metoprolol tartrate  25 mg Oral BID  . montelukast  10 mg Oral QHS  . MUSCLE RUB   Topical QHS  . polyethylene glycol  17 g Oral BID      Time spent: 25 minutes  Richarda OverlieNayana Paula Busenbark   [facility] Pager [phone]. If 7PM-7AM, please contact night-coverage at [email or web address], Office  [phone]  password [email or web address]  07/03/2017, 11:02 AM  LOS: 3 days

## 2017-07-03 NOTE — Care Management Important Message (Signed)
Important Message  Patient Details  Name: Julie Bannerdelaide Commisso MRN: [id number] Date of Birth: 15-Oct-1922   Medicare Important Message Given:  Yes    Caren MacadamFuller, Myelle Poteat 07/03/2017, 11:22 AMImportant Message  Patient Details  Name: Julie Francis MRN: [id number] Date of Birth: 15-Oct-1922   Medicare Important Message Given:  Yes    Caren MacadamFuller, Ezmae Speers 07/03/2017, 11:22 AM

## 2017-07-04 DIAGNOSIS — E039 Hypothyroidism, unspecified: Secondary | ICD-10-CM

## 2017-07-04 DIAGNOSIS — J410 Simple chronic bronchitis: Secondary | ICD-10-CM

## 2017-07-04 DIAGNOSIS — N390 Urinary tract infection, site not specified: Secondary | ICD-10-CM

## 2017-07-04 DIAGNOSIS — I5032 Chronic diastolic (congestive) heart failure: Secondary | ICD-10-CM

## 2017-07-04 DIAGNOSIS — N3000 Acute cystitis without hematuria: Secondary | ICD-10-CM

## 2017-07-04 LAB — COMPREHENSIVE METABOLIC PANEL
ALBUMIN: 3 g/dL — AB (ref 3.5–5.0)
ALK PHOS: 40 U/L (ref 38–126)
ALT: 9 U/L — AB (ref 14–54)
AST: 11 U/L — AB (ref 15–41)
Anion gap: 5 (ref 5–15)
BUN: 17 mg/dL (ref 6–20)
CALCIUM: 10.6 mg/dL — AB (ref 8.9–10.3)
CHLORIDE: 108 mmol/L (ref 101–111)
CO2: 26 mmol/L (ref 22–32)
CREATININE: 0.86 mg/dL (ref 0.44–1.00)
GFR calc non Af Amer: 57 mL/min — ABNORMAL LOW (ref >60)
GLUCOSE: 97 mg/dL (ref 65–99)
Potassium: 3.5 mmol/L (ref 3.5–5.1)
SODIUM: 139 mmol/L (ref 135–145)
Total Bilirubin: 0.5 mg/dL (ref 0.3–1.2)
Total Protein: 6 g/dL — ABNORMAL LOW (ref 6.5–8.1)

## 2017-07-04 LAB — GLUCOSE, CAPILLARY: Glucose-Capillary: 114 mg/dL — ABNORMAL HIGH (ref 65–99)

## 2017-07-04 MED ORDER — PANTOPRAZOLE SODIUM 40 MG PO TBEC: 40.0000 mg | DELAYED_RELEASE_TABLET | Freq: Every day | ORAL | 1 refills | Status: AC

## 2017-07-04 MED ORDER — FLUCONAZOLE 100 MG PO TABS: 100.0000 mg | ORAL_TABLET | Freq: Every day | ORAL | 0 refills | Status: AC

## 2017-07-04 MED ORDER — HYDRALAZINE HCL 25 MG PO TABS
25.0000 mg | ORAL_TABLET | Freq: Three times a day (TID) | ORAL | Status: DC
  Administered 2017-07-04: 25 mg via ORAL
  Filled 2017-07-04: qty 1

## 2017-07-04 MED ORDER — SENNA 8.6 MG PO TABS: 1.0000 | ORAL_TABLET | Freq: Every day | ORAL | 0 refills | Status: AC

## 2017-07-04 MED ORDER — POLYETHYLENE GLYCOL 3350 17 G PO PACK: 17.0000 g | PACK | Freq: Two times a day (BID) | ORAL | 0 refills | Status: AC

## 2017-07-04 MED ORDER — ACETAMINOPHEN 500 MG PO TABS: 500.0000 mg | ORAL_TABLET | Freq: Four times a day (QID) | ORAL | 0 refills | Status: AC | PRN

## 2017-07-04 MED ORDER — HYDRALAZINE HCL 25 MG PO TABS
25.0000 mg | ORAL_TABLET | Freq: Three times a day (TID) | ORAL | 0 refills | Status: AC
Start: 2017-07-04 — End: ?

## 2017-07-04 NOTE — Clinical Social Work Note (Signed)
Clinical Social Work Assessment  Patient Details  Name: Julie Francis MRN: [id number] Date of Birth: 04/27/1923  Date of referral:  07/04/17               Reason for consult:  Facility Placement                Permission sought to share information with:    Permission granted to share information::  Yes, Verbal Permission Granted  Name::     Julie Francis  Agency::     Relationship::  Daughter   Contact Information:     Housing/Transportation Living arrangements for the past 2 months:  [facility] Source of Information:  Adult Children Patient Interpreter Needed:  None Criminal Activity/Legal Involvement Pertinent to Current Situation/Hospitalization:  No - Comment as needed Significant Relationships:  Merchandiser, retailCommunity Support, Adult Children Lives with:  Facility Resident Do you feel safe going back to the place where you live?  Yes Need for family participation in patient care:  No (Coment)  Care giving concerns:    Patient is wheel chair bound but is able to pivot. Patient has deconditioned during hospital stay. After PT evaluation they are recommending short rehab for the patient before she returns to her ALF.   Social Worker assessment / plan:  CSW spoke to patient daughter by phone to discuss patient discharge plan to SNF for short rehab. Patient daughter reports she is familiar with SNF process because patient went to SNF back in 2014.  CSW completed fl2, faxed clinical information and will follow up with bed offers.   Employment status:  Retired Health and safety inspectornsurance information:  Medicare PT Recommendations:  Skilled Nursing Facility Information / Referral to community resources:  Skilled Nursing Facility  Patient/Family's Response to care:  Agreeable and Responding well to care.   Patient/Family's Understanding of and Emotional Response to Diagnosis, Current Treatment, and Prognosis:  Patient referred CSW to discuss plan of care at discharge with her daughter. Patient daughter very  knowledge of patient diagnosis and treatment.   Emotional Assessment Appearance:  Developmentally appropriate Attitude/Demeanor/Rapport:    Affect (typically observed):  Accepting, Pleasant Orientation:  Oriented to Place, Oriented to Self, Oriented to  Time, Oriented to Situation Alcohol / Substance use:  Not Applicable Psych involvement (Current and /or in the community):  No (Comment)  Discharge Needs  Concerns to be addressed:  Discharge Planning Concerns Readmission within the last 30 days:  No Current discharge risk:  Dependent with Mobility Barriers to Discharge:  No Barriers Identified   Clearance CootsNicole A Jossie Smoot, LCSW 07/04/2017, 10:38 AM

## 2017-07-04 NOTE — Progress Notes (Signed)
[facility] PROGRESS NOTE  Julie Francis RUE:[id number] DOB: 09/05/1922 DOA: 06/29/2017 PCP: Angela Cox, MD  Brief summary   83 y.o.femalewith medical history significant ofhypertension, COPD, GERD, hypothyroidism, CHF, bradycardia aneurysm, depression, who presents with nausea, vomiting, lower abdominal pain and increased urinary frequency     Assessment/Plan:  1. Probable UTI-UA positive for nitrite, patient received ceftriaxone then complete the treatment course with  Zosyn.  Urine culture grew multiple species. blood culture: NGTD. Zosyn was discontinued 2. Abdominal pain-likely from proctitis. underwent flex sig: biopsy pend. D/w patient/fasmily recommended to f/u biopsy results next week  -abd pain->Resolved  3. Dysphagia-EGD showed impacted food which was removed,  Recommend dysphagia 2 diet with thin liquids. 4. Candida esophagitis-seen on EGD, started on  Diflucan, changed to oral, to complete the treatment course. 5. Elevated troponin-mild elevation of troponin, likely from demand ischemia from hypertension. EKG showed NSR, no ST changes. -no acute chest pain.  6. Fecal impaction-proctitis seen on flexible sigmoidoscopy, likely from constipation. After receiving Tap water enema patient had another BM.  Did not require disimpaction.   7.  Rectal mass- patient underwent flexible sigmoidoscopy, which showed congested plaque-like mass in the rectum.  Biopsy obtained.  GI following. Patient/family will follow up results next week  8. Hypercalcemia- acid was 11.1, PTH ordered. Received  IV fluids.  Today calcium is 10.5, albumin is 3.1.   9. Hypothyroidism-continue Synthroid 10. Hypertension- blood pressure stable, continue hydralazine.   11. Depression-continue with Wellbutrin, Lexapro     Code Status: DNR Family Communication: d/w patient, RN. Updated her family (indicate person spoken with, relationship, and if by phone, the number) Disposition Plan: SNF  when bed is available   Consultants:  Gastroenterology  Procedures:  None    Antibiotics: Anti-infectives (From admission, onward)   Start     Dose/Rate Route Frequency Ordered Stop   07/03/17 1000  fluconazole (DIFLUCAN) tablet 200 mg     200 mg Oral Daily 07/03/17 0752     07/01/17 1200  fluconazole (DIFLUCAN) IVPB 200 mg  Status:  Discontinued     200 mg 100 mL/hr over 60 Minutes Intravenous Every 24 hours 07/01/17 1027 07/03/17 0752   06/30/17 2300  cefTRIAXone (ROCEPHIN) 1 g in dextrose 5 % 50 mL IVPB  Status:  Discontinued     1 g 100 mL/hr over 30 Minutes Intravenous  Once 06/30/17 0016 06/30/17 0208   06/30/17 0400  piperacillin-tazobactam (ZOSYN) IVPB 3.375 g  Status:  Discontinued     3.375 g 12.5 mL/hr over 240 Minutes Intravenous Every 8 hours 06/30/17 0218 07/02/17 1504   06/29/17 2300  cefTRIAXone (ROCEPHIN) 1 g in dextrose 5 % 50 mL IVPB     1 g 100 mL/hr over 30 Minutes Intravenous  Once 06/29/17 2253 06/29/17 2340        (indicate start date, and stop date if known)  HPI/Subjective: Alert. No distress. Reports feeling well.   Objective: Vitals:   07/03/17 2124 07/04/17 0341  BP: (!) 143/63 (!) 156/75  Pulse: (!) 54 (!) 52  Resp:  18  Temp: 98.4 F (36.9 C) 98.2 F (36.8 C)  SpO2: 99% 99%    Intake/Output Summary (Last 24 hours) at 07/04/2017 0939 Last data filed at 07/04/2017 0825 Gross per 24 hour  Intake 480 ml  Output -  Net 480 ml   Filed Weights   07/02/17 0529 07/03/17 0500 07/04/17 0341  Weight: 65.1 kg (143 lb 8.3 oz) 66.7 kg (147 lb 0.8 oz) 67.3  kg (148 lb 5.9 oz)    Exam:   General:  No distress   Cardiovascular: s1,s2 rrr  Respiratory: CTA BL  Abdomen: soft,  Nt, nd   Musculoskeletal: no leg edema    Data Reviewed: Basic Metabolic Panel: Recent Labs  Lab 06/29/17 1811 06/29/17 1845 07/01/17 0406 07/02/17 0531 07/04/17 0454  NA 134* 136 137 137 139  K 4.5 4.4 4.2 4.1 3.5  CL 102 103 106 107 108  CO2 24   --  26 24 26   GLUCOSE 114* 115* 86 68 97  BUN 22* 21* 17 24* 17  CREATININE 0.89 0.90 0.89 0.96 0.86  CALCIUM 11.1*  --  10.4* 10.5* 10.6*   Liver Function Tests: Recent Labs  Lab 06/29/17 1811 07/01/17 0406 07/02/17 0531 07/04/17 0454  AST 16 14* 11* 11*  ALT 10* 10* 8* 9*  ALKPHOS 61 48 47 40  BILITOT 0.8 0.9 1.0 0.5  PROT 7.8 6.4* 6.2* 6.0*  ALBUMIN 3.9 3.3* 3.1* 3.0*   Recent Labs  Lab 06/29/17 1811  LIPASE 26   No results for input(s): AMMONIA in the last 168 hours. CBC: Recent Labs  Lab 06/29/17 1811 06/29/17 1845 07/01/17 0406 07/02/17 0531  WBC 10.9*  --  13.1* 11.4*  NEUTROABS 9.0*  --   --   --   HGB 13.8 15.0 12.4 11.6*  HCT 42.8 44.0 40.7 38.4  MCV 84.4  --  86.8 88.3  PLT 657*  --  598* 529*   Cardiac Enzymes: Recent Labs  Lab 06/30/17 0603 06/30/17 1320 07/02/17 1616 07/02/17 2051 07/03/17 0412  TROPONINI <0.03 0.06* <0.03 <0.03 <0.03   BNP (last 3 results) Recent Labs    06/30/17 0017  BNP 152.4*    ProBNP (last 3 results) No results for input(s): PROBNP in the last 8760 hours.  CBG: Recent Labs  Lab 07/02/17 0758 07/02/17 1324 07/02/17 1354 07/03/17 0729 07/04/17 0728  GLUCAP 64* 67 102* 101* 114*    Recent Results (from the past 240 hour(s))  Urine Culture     Status: Abnormal   Collection Time: 06/29/17  6:11 PM  Result Value Ref Range Status   Specimen Description URINE, CLEAN CATCH  Final   Special Requests NONE  Final   Culture MULTIPLE SPECIES PRESENT, SUGGEST RECOLLECTION (A)  Final   Report Status 07/01/2017 FINAL  Final  Culture, blood (Routine X 2) w Reflex to ID Panel     Status: None (Preliminary result)   Collection Time: 06/30/17  1:27 AM  Result Value Ref Range Status   Specimen Description BLOOD LEFT FOREARM  Final   Special Requests   Final    BOTTLES DRAWN AEROBIC AND ANAEROBIC Blood Culture adequate volume   Culture   Final    NO GROWTH 3 DAYS Performed at [facility], [address] [place], [place] [address]    Report Status PENDING  Incomplete  Culture, blood (Routine X 2) w Reflex to ID Panel     Status: None (Preliminary result)   Collection Time: 06/30/17  1:36 AM  Result Value Ref Range Status   Specimen Description BLOOD RIGHT HAND  Final   Special Requests   Final    BOTTLES DRAWN AEROBIC AND ANAEROBIC Blood Culture adequate volume   Culture   Final    NO GROWTH 3 DAYS Performed at [facility], [address] [place], [place] [address]    Report Status PENDING  Incomplete  MRSA PCR Screening  Status: None   Collection Time: 06/30/17  8:55 PM  Result Value Ref Range Status   MRSA by PCR NEGATIVE NEGATIVE Final    Comment:        The GeneXpert MRSA Assay (FDA approved for NASAL specimens only), is one component of a comprehensive MRSA colonization surveillance program. It is not intended to diagnose MRSA infection nor to guide or monitor treatment for MRSA infections.      Studies: No results found.  Scheduled Meds: . acetaminophen  500 mg Oral TID  . aspirin EC  81 mg Oral QHS  . buPROPion  150 mg Oral Daily  . dorzolamide-timolol  1 drop Both Eyes 2 times per day  . enoxaparin (LOVENOX) injection  40 mg Subcutaneous Daily  . escitalopram  10 mg Oral Daily  . fluconazole  200 mg Oral Daily  . hydrALAZINE  10 mg Oral Q8H  . latanoprost  1 drop Both Eyes QHS  . levothyroxine  50 mcg Oral QAC breakfast  . metoprolol tartrate  25 mg Oral BID  . montelukast  10 mg Oral QHS  . MUSCLE RUB   Topical QHS  . polyethylene glycol  17 g Oral BID   Continuous Infusions:  Principal Problem:   Acute lower UTI Active Problems:   Hypothyroidism   Essential hypertension   Depression   GERD (gastroesophageal reflux disease)   Abdominal pain   Hypercalcemia   COPD (chronic obstructive pulmonary disease) (HCC)   Diastolic CHF (HCC)   UTI (urinary tract infection)   Nausea and vomiting    Time spent: >35 minutes     Esperanza SheetsBURIEV,  Enoc Getter N  [facility] Pager [phone]. If 7PM-7AM, please contact night-coverage at [email or web address], password [email or web address] 07/04/2017, 9:39 AM  LOS: 4 days

## 2017-07-04 NOTE — [place] FL2 (Signed)
[place] MEDICAID FL2 LEVEL OF CARE SCREENING TOOL     IDENTIFICATION  Patient Name: Julie Francis Birthdate: 1923-06-03 Sex: female Admission Date (Current Location): 06/29/2017  [facility] [place]   Facility and Address:  [facility],  [address], [place] [address]      Provider Number: [id number]  Attending Physician Name and Address:  Esperanza SheetsBuriev, Ulugbek N, MD  Relative Name and Phone Number:       Current Level of Care: Hospital Recommended Level of Care: Skilled Nursing Facility Prior Approval Number:    Date Approved/Denied:   PASRR Number: [id number]  Discharge Plan: SNF    Current Diagnoses: Patient Active Problem List   Diagnosis Date Noted  . Nausea and vomiting 07/01/2017  . Diastolic CHF (HCC) 06/30/2017  . UTI (urinary tract infection) 06/30/2017  . GERD (gastroesophageal reflux disease) 06/29/2017  . Abdominal pain 06/29/2017  . Hypercalcemia 06/29/2017  . COPD (chronic obstructive pulmonary disease) (HCC) 06/29/2017  . Overactive bladder 12/09/2012  . Depression 12/09/2012  . CAP (community acquired pneumonia) 09/15/2012  . Anorexia 09/13/2012  . Fall 09/12/2012  . Essential hypertension 09/12/2012  . Hyponatremia 09/12/2012  . Hyperkalemia 09/12/2012  . Unspecified constipation 09/12/2012  . Encephalopathy, toxic 09/11/2012  . Acute lower UTI 09/11/2012  . Hypothyroidism 09/11/2012  . Glaucoma 09/11/2012  . Macular degeneration 09/11/2012    Orientation RESPIRATION BLADDER Height & Weight     Self, Time, Place, Situation  Normal Incontinent Weight: 148 lb 5.9 oz (67.3 kg) Height:  5\' 4"  (162.6 cm)  BEHAVIORAL SYMPTOMS/MOOD NEUROLOGICAL BOWEL NUTRITION STATUS      Continent Diet(Low Sodium Heart Healthy )  AMBULATORY STATUS COMMUNICATION OF NEEDS Skin   Extensive Assist Verbally Normal                       Personal Care Assistance Level of Assistance  Feeding, Dressing, Bathing Bathing  Assistance: Maximum assistance Feeding assistance: Independent Dressing Assistance: Maximum assistance     Functional Limitations Info  Sight, Hearing, Speech Sight Info: Adequate Hearing Info: Adequate Speech Info: Adequate    SPECIAL CARE FACTORS FREQUENCY  PT (By licensed PT), OT (By licensed OT)     PT Frequency: 5X/WEEK OT Frequency: 5X/WEEK            Contractures Contractures Info: Not present    Additional Factors Info  Code Status, Allergies, Psychotropic Code Status Info: DNR Allergies Info: : No Known Allergies Psychotropic Info: Wellbutrin, Lexapro         Current Medications (07/04/2017):  This is the current hospital active medication list Current Facility-Administered Medications  Medication Dose Route Frequency Provider Last Rate Last Dose  . acetaminophen (TYLENOL) tablet 650 mg  650 mg Oral Q6H PRN Lorretta HarpNiu, Xilin, MD       Or  . acetaminophen (TYLENOL) suppository 650 mg  650 mg Rectal Q6H PRN Lorretta HarpNiu, Xilin, MD      . acetaminophen (TYLENOL) tablet 500 mg  500 mg Oral TID Meredeth IdeLama, Gagan S, MD   500 mg at 07/04/17 1032  . aspirin EC tablet 81 mg  81 mg Oral QHS Meredeth IdeLama, Gagan S, MD   81 mg at 07/03/17 2130  . buPROPion (WELLBUTRIN XL) 24 hr tablet 150 mg  150 mg Oral Daily Meredeth IdeLama, Gagan S, MD   150 mg at 07/04/17 1032  . dorzolamide-timolol (COSOPT) 22.3-6.8 MG/ML ophthalmic solution 1 drop  1 drop Both Eyes 2 times per day [place], Gagan S,  MD   1 drop at 07/03/17 0933  . enoxaparin (LOVENOX) injection 40 mg  40 mg Subcutaneous Daily Lorretta HarpNiu, Xilin, MD   40 mg at 07/04/17 1031  . escitalopram (LEXAPRO) tablet 10 mg  10 mg Oral Daily Meredeth IdeLama, Gagan S, MD   10 mg at 07/04/17 1033  . fluconazole (DIFLUCAN) tablet 200 mg  200 mg Oral Daily Herby AbrahamBell, Michelle T, RPH   200 mg at 07/04/17 1033  . hydrALAZINE (APRESOLINE) injection 5 mg  5 mg Intravenous Q2H PRN Lorretta HarpNiu, Xilin, MD   5 mg at 06/30/17 1815  . hydrALAZINE (APRESOLINE) tablet 25 mg  25 mg Oral Q8H Buriev, Ulugbek N, MD      .  ipratropium-albuterol (DUONEB) 0.5-2.5 (3) MG/3ML nebulizer solution 3 mL  3 mL Nebulization Q4H PRN Meredeth IdeLama, Gagan S, MD   3 mL at 07/03/17 1116  . latanoprost (XALATAN) 0.005 % ophthalmic solution 1 drop  1 drop Both Eyes QHS Meredeth IdeLama, Gagan S, MD   1 drop at 07/03/17 2129  . levothyroxine (SYNTHROID, LEVOTHROID) tablet 50 mcg  50 mcg Oral QAC breakfast Meredeth IdeLama, Gagan S, MD   50 mcg at 07/04/17 0754  . montelukast (SINGULAIR) tablet 10 mg  10 mg Oral QHS Meredeth IdeLama, Gagan S, MD   10 mg at 07/03/17 2129  . MUSCLE RUB CREA   Topical QHS Meredeth IdeLama, Gagan S, MD   1 application at 07/03/17 2129  . ondansetron (ZOFRAN) tablet 4 mg  4 mg Oral Q6H PRN Lorretta HarpNiu, Xilin, MD       Or  . ondansetron [facility](ZOFRAN) injection 4 mg  4 mg Intravenous Q6H PRN Lorretta HarpNiu, Xilin, MD   4 mg at 06/30/17 [id number]  . polyethylene glycol (MIRALAX / GLYCOLAX) packet 17 g  17 g Oral BID Richarda OverlieAbrol, Nayana, MD   17 g at 07/04/17 1031  . zolpidem (AMBIEN) tablet 5 mg  5 mg Oral QHS PRN Lorretta HarpNiu, Xilin, MD         Discharge Medications: Please see discharge summary for a list of discharge medications.  Relevant Imaging Results:  Relevant Lab Results:   Additional Information ssn:[id number]  Clearance CootsNicole A Jaquay Posthumus, LCSW

## 2017-07-04 NOTE — Clinical Social Work Placement (Addendum)
[facility] scheduled to pick patient up at 2:00pm.  CLINICAL SOCIAL WORK PLACEMENT  NOTE  Date:  07/04/2017  Patient Details  Name: Julie Francis MRN: [id number] Date of Birth: 08-11-1922  Clinical Social Work is seeking post-discharge placement for this patient at the Skilled  Nursing Facility level of care (*CSW will initial, date and re-position this form in  chart as items are completed):  Yes   Patient/family provided with [place] Clinical Social Work Department's list of facilities offering this level of care within the geographic area requested by the patient (or if unable, by the patient's family).  Yes   Patient/family informed of their freedom to choose among providers that offer the needed level of care, that participate in Medicare, Medicaid or managed care program needed by the patient, have an available bed and are willing to accept the patient.  Yes   Patient/family informed of [facility]'s ownership interest in [facility] and [facility], as well as of the fact that they are under no obligation to receive care at these facilities.  PASRR submitted to EDS on       PASRR number received on       Existing PASRR number confirmed on 07/04/17     FL2 transmitted to all facilities in geographic area requested by pt/family on       FL2 transmitted to all facilities within larger geographic area on 07/04/17     Patient informed that his/her managed care company has contracts with or will negotiate with certain facilities, including the following:  [facility]         Patient/family informed of bed offers received.  [facility]     Physician recommends and patient chooses bed at      [facility] 07[facility].  Patient to be transferred to facility by PT[facility]      Patient family notified on 07/04/17 of transfer.  Name of family member notified:  Daughter-[name].     PHYSICIAN Please sign DNR, Please prepare priority  discharge summary, including medications, Please sign FL2     Additional Comment:    _______________________________________________ Clearance CootsNicole A Finlee Milo, LCSW 07/04/2017, 11:31 AM

## 2017-07-04 NOTE — Discharge Summary (Signed)
Physician Discharge Summary  [place] Francis:[id number] DOB: 27-Feb-1923 DOA: 06/29/2017  PCP: Angela Coxasanayaka, Julie Y, MD  Admit date: 06/29/2017 Discharge date: 07/04/2017  Time spent: >35 minutes  Recommendations for Outpatient Follow-up:  F/u with GI with biopsy results next week  F/u with MD in 3-4 days at SNF  Discharge Diagnoses:  Principal Problem:   Acute lower UTI Active Problems:   Hypothyroidism   Essential hypertension   Depression   GERD (gastroesophageal reflux disease)   Abdominal pain   Hypercalcemia   COPD (chronic obstructive pulmonary disease) (HCC)   Diastolic CHF (HCC)   UTI (urinary tract infection)   Nausea and vomiting   Discharge Condition: stable   Diet recommendation: dys II  Filed Weights   07/02/17 0529 07/03/17 0500 07/04/17 0341  Weight: 65.1 kg (143 lb 8.3 oz) 66.7 kg (147 lb 0.8 oz) 67.3 kg (148 lb 5.9 oz)    History of present illness:   83 Francis.o.femalewith medical history significant ofhypertension, COPD, GERD, hypothyroidism, CHF, bradycardia aneurysm, depression, who presents with nausea, vomiting, lower abdominal pain and increased urinary frequency    Hospital Course:   1. Probable UTI-UA positive for nitrite, patient received ceftriaxone then complete the treatment course with  Zosyn. Urine culture grew multiple species. blood culture: NGTD. Zosyn was discontinued 2. Abdominal pain-likely from proctitis. underwent flex sig: biopsy pend. D/w patient/fasmily recommended to f/u biopsy results next week  -abd pain->Resolved  3. Dysphagia-EGD showed impacted food which was removed, Recommend dysphagia 2 diet with thin liquids. 4. Candida esophagitis-seen on EGD, started on Diflucan, changed to oral, to complete the treatment course. 5. Elevated troponin-mild elevation of troponin, likely from demand ischemia from hypertension. EKG showed NSR, no ST changes. -no acute chest pain.  6. Fecal impaction-proctitis seen on flexible  sigmoidoscopy, likely from constipation.After receivingTap water enema patient had anotherBM. Did not require disimpaction. 7. Rectal mass- patient underwent flexible sigmoidoscopy, which showed congested plaque-like mass in the rectum. Biopsy obtained. GI following. Patient/family will follow up results next week  8. Hypercalcemia-acid was 11.1, PTH ordered. Received  IV fluids. Today calcium is 10.5, albumin is 3.1.  9. Hypothyroidism-continue Synthroid 10. Hypertension- blood pressure stable, continue hydralazine.  11. Depression-continue with Wellbutrin, Lexapro   D/w patient, updated her family   Procedures:  EGD Flex sig   Consultations:  GI  Discharge Exam: Vitals:   07/03/17 2124 07/04/17 0341  BP: (!) 143/63 (!) 156/75  Pulse: (!) 54 (!) 52  Resp:  18  Temp: 98.4 F (36.9 C) 98.2 F (36.8 C)  SpO2: 99% 99%    General: alert. No distress  Cardiovascular: S1,S2 Respiratory: CTA BL   Discharge Instructions  Discharge Instructions    Diet - low sodium heart healthy   Complete by:  As directed    Discharge instructions   Complete by:  As directed    Please follow up biopsy results with gastroenterologist next week   Increase activity slowly   Complete by:  As directed      Allergies as of 07/04/2017   No Known Allergies     Medication List    STOP taking these medications   ANTI-DIARRHEAL 2 MG tablet Generic drug:  loperamide   guaiFENesin 600 MG 12 hr tablet Commonly known as:  MUCINEX   omeprazole 20 MG capsule Commonly known as:  PRILOSEC   oxybutynin 5 MG 24 hr tablet Commonly known as:  DITROPAN-XL     TAKE these medications   acetaminophen 500 MG tablet  Commonly known as:  TYLENOL Take 1 tablet (500 mg total) by mouth every 6 (six) hours as needed for mild pain or headache. What changed:    when to take this  reasons to take this   albuterol (2.5 MG/3ML) 0.083% nebulizer solution Commonly known as:  PROVENTIL Take  2.5 mg by nebulization 3 (three) times daily as needed for wheezing or shortness of breath.   aspirin EC 81 MG tablet Take 81 mg by mouth at bedtime.   AZO CRANBERRY URINARY TRACT 250-60 MG Caps Generic drug:  Cranberry-Vitamin C Take 1 capsule by mouth every morning.   buPROPion 150 MG 24 hr tablet Commonly known as:  WELLBUTRIN XL Take 150 mg by mouth every morning.   clotrimazole 1 % cream Commonly known as:  LOTRIMIN Apply to affected area 2 times daily   dorzolamide-timolol 22.3-6.8 MG/ML ophthalmic solution Commonly known as:  COSOPT Place 1 drop into both eyes 2 (two) times daily. 0900 and 1700   escitalopram 10 MG tablet Commonly known as:  LEXAPRO Take 10 mg by mouth daily.   fluconazole 100 MG tablet Commonly known as:  DIFLUCAN Take 1 tablet (100 mg total) by mouth daily for 7 days.   hydrALAZINE 25 MG tablet Commonly known as:  APRESOLINE Take 1 tablet (25 mg total) by mouth every 8 (eight) hours. What changed:    medication strength  how much to take  when to take this  additional instructions   ipratropium-albuterol 0.5-2.5 (3) MG/3ML Soln Commonly known as:  DUONEB Take 3 mLs by nebulization every 4 (four) hours as needed (breathing).   latanoprost 0.005 % ophthalmic solution Commonly known as:  XALATAN Place 1 drop into both eyes at bedtime.   levothyroxine 50 MCG tablet Commonly known as:  SYNTHROID, LEVOTHROID Take 50 mcg by mouth daily before breakfast.   montelukast 10 MG tablet Commonly known as:  SINGULAIR Take 10 mg by mouth at bedtime.   pantoprazole 40 MG tablet Commonly known as:  PROTONIX Take 1 tablet (40 mg total) by mouth daily.   polyethylene glycol packet Commonly known as:  MIRALAX / GLYCOLAX Take 17 g by mouth 2 (two) times daily.   senna 8.6 MG Tabs tablet Commonly known as:  SENOKOT Take 1 tablet (8.6 mg total) by mouth daily.   tiotropium 18 MCG inhalation capsule Commonly known as:  SPIRIVA Place 18 mcg into  inhaler and inhale daily.   trolamine salicylate 10 % cream Commonly known as:  ASPERCREME Apply 1 application topically at bedtime. Apply topically to legs   Vitamin D 2000 units Caps Take 2,000 Units by mouth daily.      No Known Allergies    The results of significant diagnostics from this hospitalization (including imaging, microbiology, ancillary and laboratory) are listed below for reference.    Significant Diagnostic Studies: Ct Abdomen Pelvis W Contrast  Result Date: 06/29/2017 CLINICAL DATA:  83 year old female with abdominal and pelvic distention. EXAM: CT ABDOMEN AND PELVIS WITH CONTRAST TECHNIQUE: Multidetector CT imaging of the abdomen and pelvis was performed using the standard protocol following bolus administration of intravenous contrast. CONTRAST:  100 cc intravenous Isovue-300 COMPARISON:  None. FINDINGS: Lower chest: No acute abnormality. Mild right lower lobe cylindrical bronchiectasis and scarring identified. Hepatobiliary: The liver is unremarkable except for hepatic cysts. Gallbladder sludge versus cholelithiasis noted without CT evidence of acute cholecystitis. There is no evidence of biliary dilatation. Pancreas: Unremarkable Spleen: Unremarkable Adrenals/Urinary Tract: Mild circumferential bladder wall thickening is nonspecific but may  represent cystitis. A punctate nonobstructing right upper pole renal calculus noted. No obstructing urinary calculi are identified. The adrenal glands are unremarkable. Stomach/Bowel: A moderate hiatal hernia is noted. There is no evidence of bowel obstruction. Colonic diverticulosis noted without evidence of diverticulitis. A moderate to large amount of stool within the distal sigmoid colon noted. There may be circumferential wall thickening of the rectum present. Vascular/Lymphatic: Aortic atherosclerosis. No enlarged abdominal or pelvic lymph nodes. Reproductive: Uterus and bilateral adnexa are unremarkable. Other: No ascites, abscess  or pneumoperitoneum. Musculoskeletal: No acute bony abnormalities. No suspicious bony lesions noted. IMPRESSION: 1. Question circumferential rectal wall thickening which may represent proctitis or possibly rectal mass. Consider direct inspection/physical examination. No evidence of bowel obstruction. Moderate to large amount of distal sigmoid stool. 2. Gallbladder sludge versus cholelithiasis. No CT evidence of acute cholecystitis. 3. Mild circumferential bladder wall thickening of uncertain chronicity. Correlate with UTI/cystitis. 4. Moderate hiatal hernia 5. Punctate nonobstructing right renal calculus 6. Right lower lobe bronchiectasis and scarring 7.  Aortic Atherosclerosis (ICD10-I70.0). Electronically Signed   By: Harmon PierJeffrey  Hu M.D.   On: 06/29/2017 23:33    Microbiology: Recent Results (from the past 240 hour(s))  Urine Culture     Status: Abnormal   Collection Time: 06/29/17  6:11 PM  Result Value Ref Range Status   Specimen Description URINE, CLEAN CATCH  Final   Special Requests NONE  Final   Culture MULTIPLE SPECIES PRESENT, SUGGEST RECOLLECTION (A)  Final   Report Status 07/01/2017 FINAL  Final  Culture, blood (Routine X 2) w Reflex to ID Panel     Status: None (Preliminary result)   Collection Time: 06/30/17  1:27 AM  Result Value Ref Range Status   Specimen Description BLOOD LEFT FOREARM  Final   Special Requests   Final    BOTTLES DRAWN AEROBIC AND ANAEROBIC Blood Culture adequate volume   Culture   Final    NO GROWTH 3 DAYS Performed at [facility], [address] [place], [place] [address]    Report Status PENDING  Incomplete  Culture, blood (Routine X 2) w Reflex to ID Panel     Status: None (Preliminary result)   Collection Time: 06/30/17  1:36 AM  Result Value Ref Range Status   Specimen Description BLOOD RIGHT HAND  Final   Special Requests   Final    BOTTLES DRAWN AEROBIC AND ANAEROBIC Blood Culture adequate volume   Culture   Final    NO GROWTH 3  DAYS Performed at [facility], [address] [place], [place] [address]    Report Status PENDING  Incomplete  MRSA PCR Screening     Status: None   Collection Time: 06/30/17  8:55 PM  Result Value Ref Range Status   MRSA by PCR NEGATIVE NEGATIVE Final    Comment:        The GeneXpert MRSA Assay (FDA approved for NASAL specimens only), is one component of a comprehensive MRSA colonization surveillance program. It is not intended to diagnose MRSA infection nor to guide or monitor treatment for MRSA infections.      Labs: Basic Metabolic Panel: Recent Labs  Lab 06/29/17 1811 06/29/17 1845 07/01/17 0406 07/02/17 0531 07/04/17 0454  NA 134* 136 137 137 139  K 4.5 4.4 4.2 4.1 3.5  CL 102 103 106 107 108  CO2 24  --  26 24 26   GLUCOSE 114* 115* 86 68 97  BUN 22* 21* 17 24* 17  CREATININE  0.89 0.90 0.89 0.96 0.86  CALCIUM 11.1*  --  10.4* 10.5* 10.6*   Liver Function Tests: Recent Labs  Lab 06/29/17 1811 07/01/17 0406 07/02/17 0531 07/04/17 0454  AST 16 14* 11* 11*  ALT 10* 10* 8* 9*  ALKPHOS 61 48 47 40  BILITOT 0.8 0.9 1.0 0.5  PROT 7.8 6.4* 6.2* 6.0*  ALBUMIN 3.9 3.3* 3.1* 3.0*   Recent Labs  Lab 06/29/17 1811  LIPASE 26   No results for input(s): AMMONIA in the last 168 hours. CBC: Recent Labs  Lab 06/29/17 1811 06/29/17 1845 07/01/17 0406 07/02/17 0531  WBC 10.9*  --  13.1* 11.4*  NEUTROABS 9.0*  --   --   --   HGB 13.8 15.0 12.4 11.6*  HCT 42.8 44.0 40.7 38.4  MCV 84.4  --  86.8 88.3  PLT 657*  --  598* 529*   Cardiac Enzymes: Recent Labs  Lab 06/30/17 0603 06/30/17 1320 07/02/17 1616 07/02/17 2051 07/03/17 0412  TROPONINI <0.03 0.06* <0.03 <0.03 <0.03   BNP: BNP (last 3 results) Recent Labs    06/30/17 0017  BNP 152.4*    ProBNP (last 3 results) No results for input(s): PROBNP in the last 8760 hours.  CBG: Recent Labs  Lab 07/02/17 0758 07/02/17 1324 07/02/17 1354 07/03/17 0729 07/04/17 0728  GLUCAP 64* 67  102* 101* 114*       Signed:  Dametrius Sanjuan N  [facility] 07/04/2017, 9:55 AM

## 2017-07-05 LAB — CULTURE, BLOOD (ROUTINE X 2)
CULTURE: NO GROWTH
Culture: NO GROWTH
SPECIAL REQUESTS: ADEQUATE
Special Requests: ADEQUATE

## 2017-12-27 DEATH — deceased

## 2018-08-20 IMAGING — CT CT ABD-PELV W/ CM
2 of 5 series · 15 of 46 positions shown, 17 images · IV contrast (agent unspecified)
Comparison: None.

CLINICAL DATA: [AGE] female with abdominal and pelvic
distention.

EXAM:
CT ABDOMEN AND PELVIS WITH CONTRAST
TECHNIQUE: Multidetector CT imaging of the abdomen and pelvis was performed
using the standard protocol following bolus administration of
intravenous contrast.
CONTRAST:  100 cc intravenous [id number]

[Series 2: abd/pel with · axial · 0.95mm/px · z∈[-784,-444]mm · 12 of 82 slices shown, 14 images]
[im 7/82  soft-tissue]
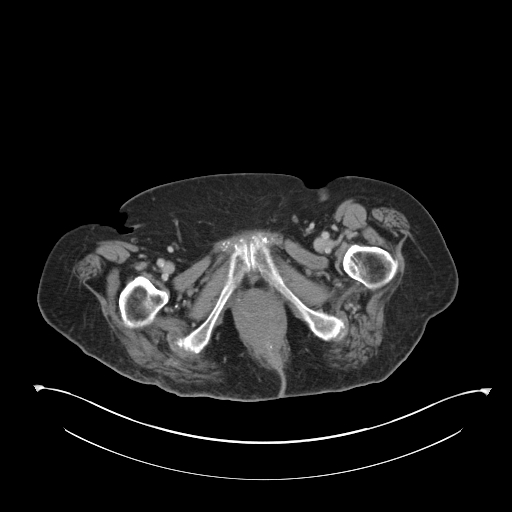
[im 7/82  bone]
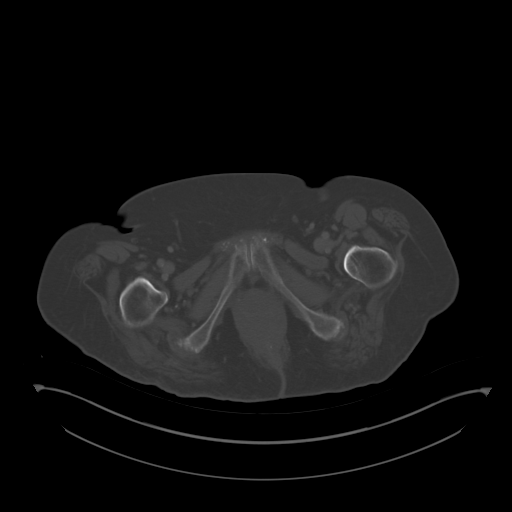
[im 13/82  soft-tissue]
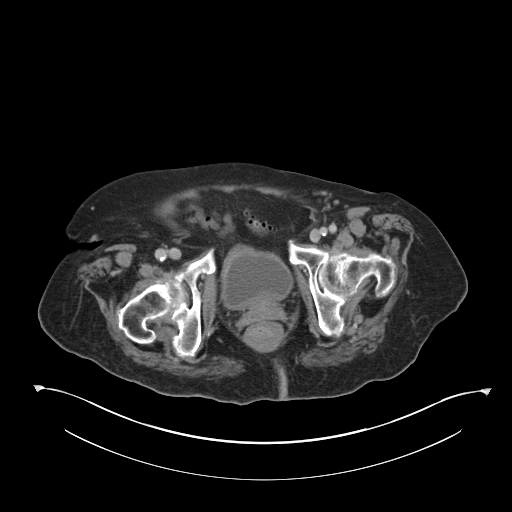
[im 19/82  soft-tissue]
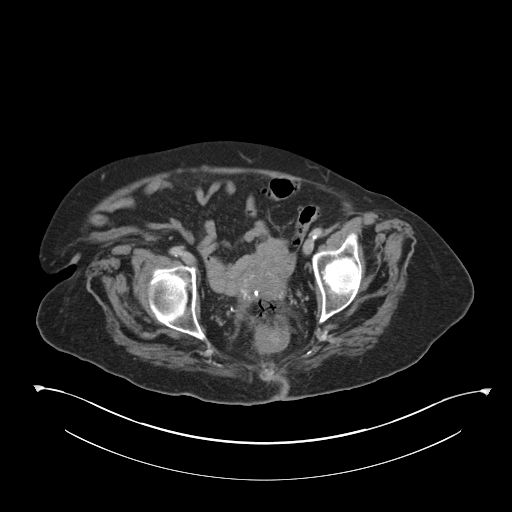
[im 25/82  soft-tissue]
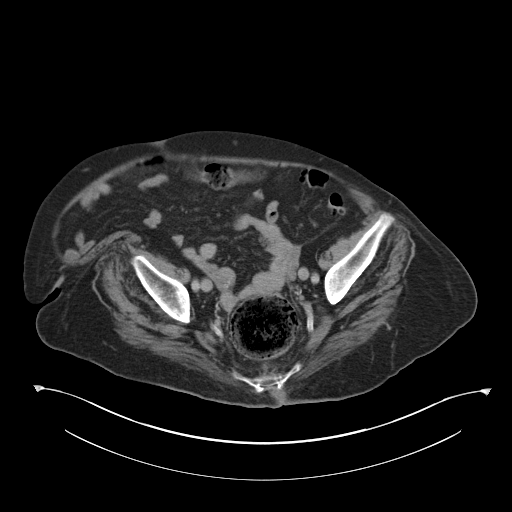
[im 32/82  soft-tissue]
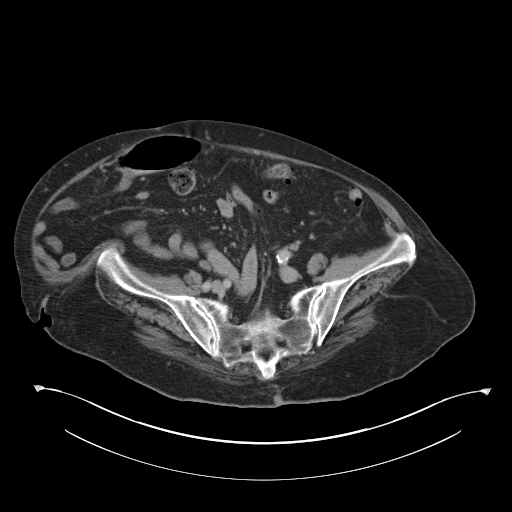
[im 38/82  soft-tissue]
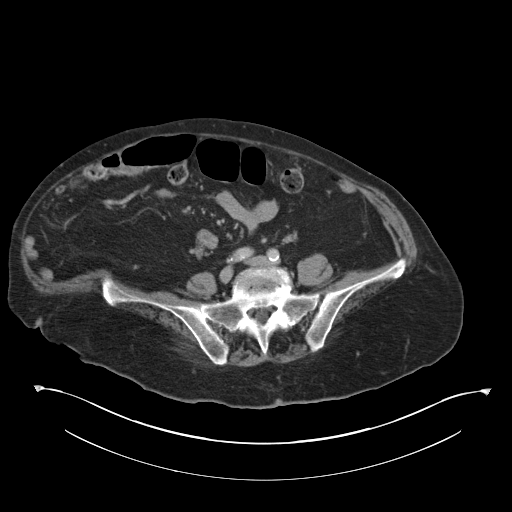
[im 44/82  soft-tissue]
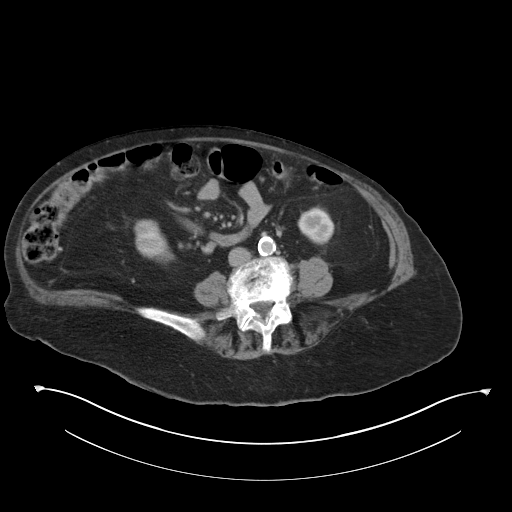
[im 50/82  soft-tissue]
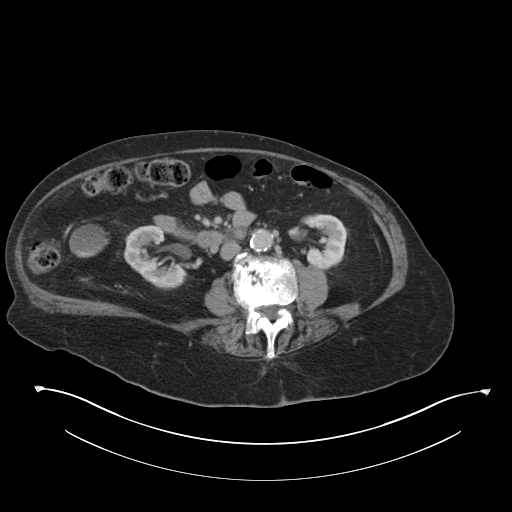
[im 57/82  soft-tissue]
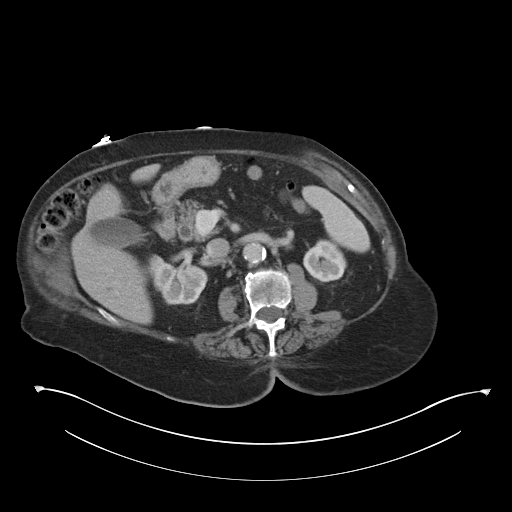
[im 57/82  bone]
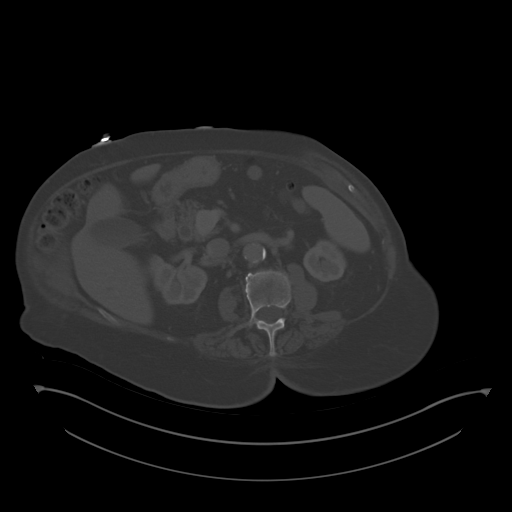
[im 63/82  soft-tissue]
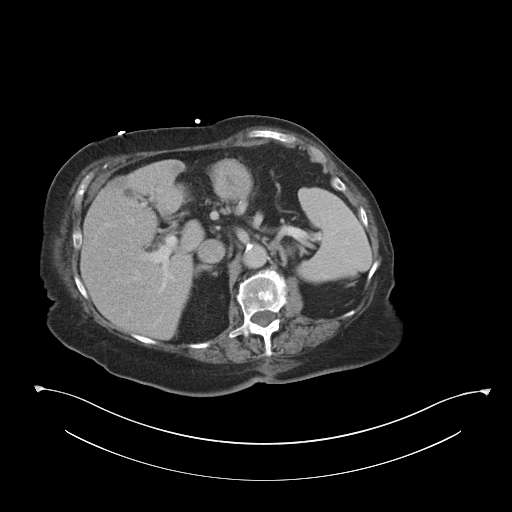
[im 69/82  soft-tissue]
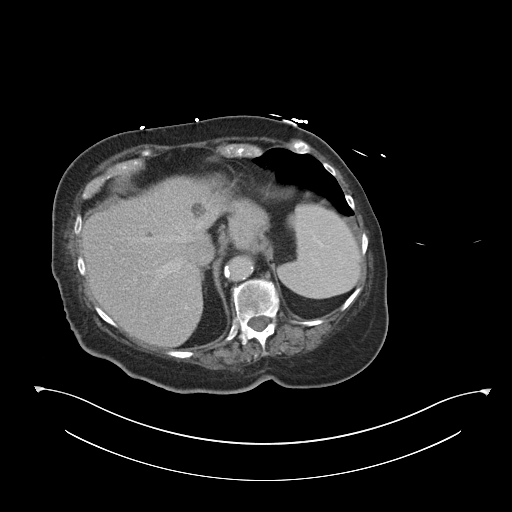
[im 75/82  soft-tissue]
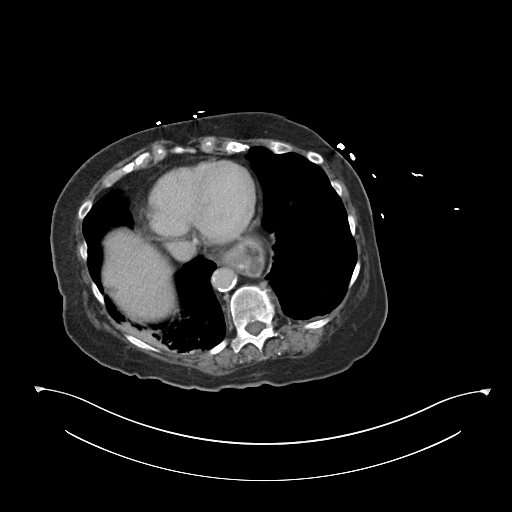

[Series 4: coronal a/|p · coronal · 0.83mm/px · 3 of 154 slices shown]
[im 52/154  soft-tissue]
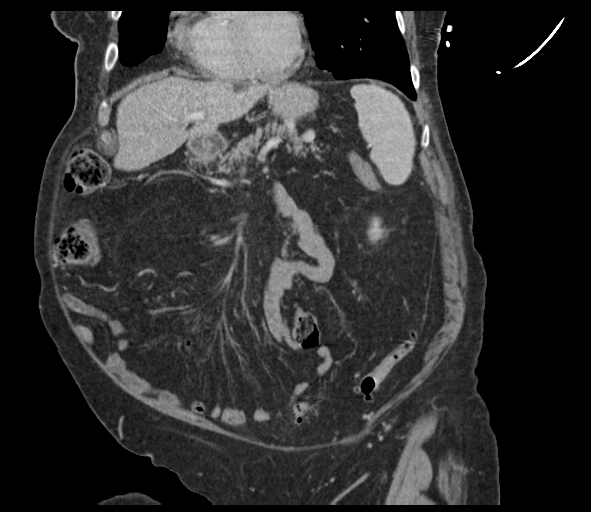
[im 69/154  soft-tissue]
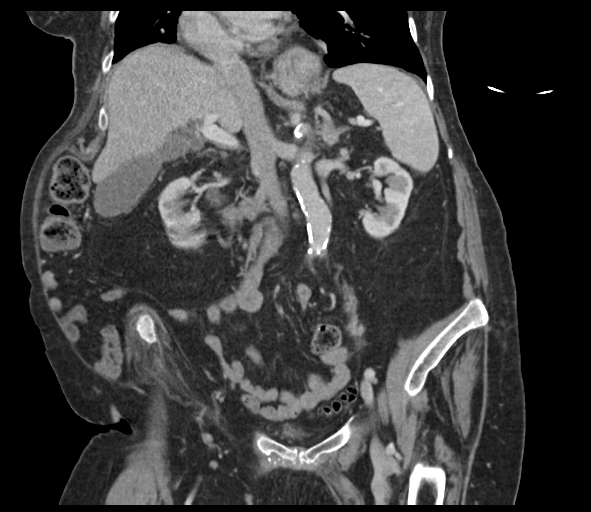
[im 86/154  soft-tissue]
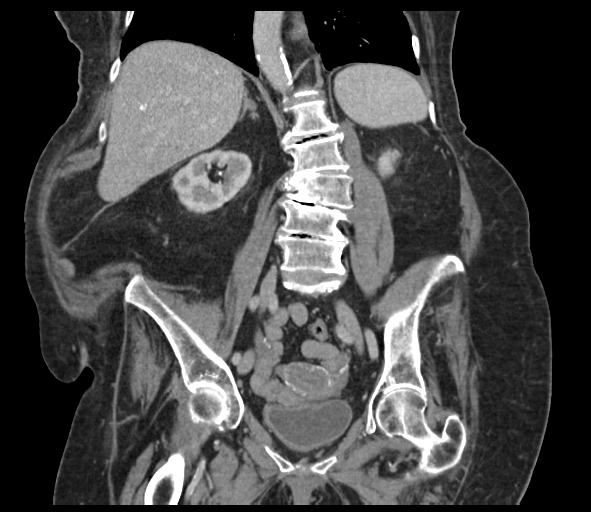

[15 of 46 positions shown; findings below may reference images not displayed]

FINDINGS: Lower chest: No acute abnormality. Mild right lower lobe cylindrical
bronchiectasis and scarring identified.

Hepatobiliary: The liver is unremarkable except for hepatic cysts.
Gallbladder sludge versus cholelithiasis noted without CT evidence
of acute cholecystitis. There is no evidence of biliary dilatation.

Pancreas: Unremarkable

Spleen: Unremarkable

Adrenals/Urinary Tract: Mild circumferential bladder wall thickening
is nonspecific but may represent cystitis. A punctate nonobstructing
right upper pole renal calculus noted. No obstructing urinary
calculi are identified. The adrenal glands are unremarkable.

Stomach/Bowel: A moderate hiatal hernia is noted. There is no
evidence of bowel obstruction. Colonic diverticulosis noted without
evidence of diverticulitis. A moderate to large amount of stool
within the distal sigmoid colon noted. There may be circumferential
wall thickening of the rectum present.

Vascular/Lymphatic: Aortic atherosclerosis. No enlarged abdominal or
pelvic lymph nodes.

Reproductive: Uterus and bilateral adnexa are unremarkable.

Other: No ascites, abscess or pneumoperitoneum.

Musculoskeletal: No acute bony abnormalities. No suspicious bony
lesions noted.
IMPRESSION: 1. Question circumferential rectal wall thickening which may
represent proctitis or possibly rectal mass. Consider direct
inspection/physical examination. No evidence of bowel obstruction.
Moderate to large amount of distal sigmoid stool.
2. Gallbladder sludge versus cholelithiasis. No CT evidence of acute
cholecystitis.
3. Mild circumferential bladder wall thickening of uncertain
chronicity. Correlate with UTI/cystitis.
4. Moderate hiatal hernia
5. Punctate nonobstructing right renal calculus
6. Right lower lobe bronchiectasis and scarring
7.  Aortic Atherosclerosis ([id number]).
